# Patient Record
Sex: Female | Born: 1965 | Race: White | Hispanic: No | Smoking: Never smoker
Health system: Southern US, Community
[De-identification: ages and names within clinical notes are randomized; demographics above are authoritative.]

## PROBLEM LIST (undated history)

## (undated) ENCOUNTER — Emergency Department (HOSPITAL_COMMUNITY): Disposition: A | Discharge status: Home / Self Care | Payer: Self-pay

## (undated) DIAGNOSIS — M1990 Unspecified osteoarthritis, unspecified site: Secondary | ICD-10-CM

## (undated) DIAGNOSIS — D649 Anemia, unspecified: Secondary | ICD-10-CM

## (undated) DIAGNOSIS — G8929 Other chronic pain: Secondary | ICD-10-CM

## (undated) DIAGNOSIS — G43909 Migraine, unspecified, not intractable, without status migrainosus: Secondary | ICD-10-CM

## (undated) DIAGNOSIS — I2699 Other pulmonary embolism without acute cor pulmonale: Secondary | ICD-10-CM

## (undated) DIAGNOSIS — K219 Gastro-esophageal reflux disease without esophagitis: Secondary | ICD-10-CM

## (undated) DIAGNOSIS — R519 Headache, unspecified: Secondary | ICD-10-CM

## (undated) DIAGNOSIS — F419 Anxiety disorder, unspecified: Secondary | ICD-10-CM

## (undated) DIAGNOSIS — R51 Headache: Secondary | ICD-10-CM

## (undated) HISTORY — DX: Headache: R51

## (undated) HISTORY — PX: RECONSTRUCTION OF NOSE: SHX2301

## (undated) HISTORY — DX: Gastro-esophageal reflux disease without esophagitis: K21.9

## (undated) HISTORY — DX: Unspecified osteoarthritis, unspecified site: M19.90

## (undated) HISTORY — DX: Other pulmonary embolism without acute cor pulmonale: I26.99

## (undated) HISTORY — DX: Migraine, unspecified, not intractable, without status migrainosus: G43.909

## (undated) HISTORY — PX: NASAL SINUS SURGERY: SHX719

## (undated) HISTORY — PX: TONSILLECTOMY: SHX5217

## (undated) HISTORY — PX: OTHER SURGICAL HISTORY: SHX169

## (undated) HISTORY — DX: Other chronic pain: G89.29

## (undated) HISTORY — DX: Anxiety disorder, unspecified: F41.9

## (undated) HISTORY — DX: Headache, unspecified: R51.9

## (undated) HISTORY — DX: Anemia, unspecified: D64.9

---

## 2000-11-10 ENCOUNTER — Other Ambulatory Visit: Admission: RE | Admit: 2000-11-10 | Discharge: 2000-11-10 | Payer: Self-pay | Admitting: Obstetrics and Gynecology

## 2001-12-02 ENCOUNTER — Other Ambulatory Visit: Admission: RE | Admit: 2001-12-02 | Discharge: 2001-12-02 | Payer: Self-pay | Admitting: Obstetrics and Gynecology

## 2007-07-29 HISTORY — PX: COLONOSCOPY: SHX174

## 2007-07-29 HISTORY — PX: ESOPHAGOGASTRODUODENOSCOPY: SHX1529

## 2007-10-15 ENCOUNTER — Ambulatory Visit: Payer: Self-pay | Admitting: Gastroenterology

## 2007-10-25 ENCOUNTER — Ambulatory Visit: Payer: Self-pay | Admitting: Gastroenterology

## 2007-11-15 ENCOUNTER — Ambulatory Visit: Payer: Self-pay | Admitting: Gastroenterology

## 2007-11-15 LAB — CONVERTED CEMR LAB
Fecal Occult Blood: NEGATIVE
OCCULT 5: NEGATIVE

## 2007-11-18 ENCOUNTER — Ambulatory Visit: Payer: Self-pay | Admitting: Gastroenterology

## 2007-11-18 DIAGNOSIS — K209 Esophagitis, unspecified without bleeding: Secondary | ICD-10-CM | POA: Insufficient documentation

## 2007-11-20 DIAGNOSIS — Z9089 Acquired absence of other organs: Secondary | ICD-10-CM | POA: Insufficient documentation

## 2007-11-20 DIAGNOSIS — K219 Gastro-esophageal reflux disease without esophagitis: Secondary | ICD-10-CM | POA: Insufficient documentation

## 2007-11-20 DIAGNOSIS — F341 Dysthymic disorder: Secondary | ICD-10-CM

## 2007-11-24 ENCOUNTER — Telehealth: Payer: Self-pay | Admitting: Gastroenterology

## 2007-11-25 ENCOUNTER — Encounter: Payer: Self-pay | Admitting: Gastroenterology

## 2007-12-16 DIAGNOSIS — R519 Headache, unspecified: Secondary | ICD-10-CM | POA: Insufficient documentation

## 2007-12-16 DIAGNOSIS — R51 Headache: Secondary | ICD-10-CM | POA: Insufficient documentation

## 2010-12-10 NOTE — Letter (Signed)
November 18, 2007    Ms. Eino Farber   RE:  DAVIS, VANNATTER  MRN:  [id number]  /  DOB:  [birth year]   Dear Ms. Karnes:   It is my pleasure to have treated you recently as a new patient in my  office.  I appreciate your confidence and the opportunity to participate  in your care.   Since I do have a busy inpatient endoscopy schedule and office schedule,  my office hours vary weekly.  I am, however, available for emergency  calls every day through my office.  If I cannot promptly meet an urgent  office appointment, another one of our gastroenterologists will be able  to assist you.   My well-trained staff are prepared to help you at all times.  For  emergencies after office hours, a physician from our gastroenterology  section is always available through my 24-hour answering service.   While you are under my care, I encourage discussion of your questions  and concerns, and I will be happy to return your calls as soon as I am  available.   Once again, I welcome you as a new patient and I look forward to a happy  and healthy relationship.    Sincerely,      Barbette Hair. Arlyce Dice, MD,FACG  Electronically Signed   RDK/MedQ  DD: 11/18/2007  DT: 11/18/2007  Job #: [id number]

## 2010-12-10 NOTE — Assessment & Plan Note (Signed)
[facility]                         GASTROENTEROLOGY OFFICE NOTE   NAME:Jennifer Buckley, Jennifer Buckley                        MRN:          [id number]  DATE:11/18/2007                            DOB:          01-Aug-1965    PROBLEM:  Acid reflux.   Jennifer Buckley is a pleasant 45 year old white female complaining of a burning  chest discomfort.  This has been a problem for years.  Despite various  PPIs, she is suffering from frequent regurgitation and gastric contents,  hoarseness, sore throat, and pyrosis.  She has nausea especially in the  mornings and often will gag.  She currently is taking kapidex without  much improvement.  She  does take Naprosyn up to twice a week for back  pain.  She denies dysphagia or odynophagia.  Her other GI problem is hemoccult positive stools.  This prompted a  colonoscopy on October 25, 2007, which was entirely normal.  Followup  hemoccults were negative.   PAST MEDICAL HISTORY:  1. Pertinent for anxiety and depression.  2. She suffers from chronic headaches.  3. She is status post tonsillectomy and sinus surgery.   FAMILY HISTORY:  Noncontributory.   MEDICATIONS:  Femhrt, Mirapex, fluoxetine, kapidex.   ALLERGIES:  None.   She neither smokes nor drinks.  She is married and works as a Nurse, mental health.   REVIEW OF SYSTEMS:  Positive for anxiety, fatigue, headaches, and  constipation, otherwise negative.   PHYSICAL EXAMINATION:  GENERAL:  She is a healthy-appearing female.  VITAL SIGNS:  Pulse 84, blood pressure 92/64, weight 163.  HEENT: EOMI.  PERRLA.  Sclerae are anicteric.  Conjunctivae are pink.  NECK:  Supple without thyromegaly, adenopathy or carotid bruits.  CHEST:  Clear to auscultation and percussion without adventitious  sounds.  CARDIAC:  Regular rhythm; normal S1 S2.  There are no murmurs, gallops  or rubs.  ABDOMEN:  Bowel sounds are normoactive.  Abdomen is soft, nontender and  nondistended.  There are no abdominal  masses, tenderness, splenic  enlargement or hepatomegaly.  EXTREMITIES:  Full range of motion.  No cyanosis, clubbing or edema.  RECTAL:  Deferred.  SKELETAL:  There are no deformities.  NEUROLOGIC:  She is alert.  There are no focal abnormalities.   IMPRESSION:  Persistent gastroesophageal reflux disease despite proton  pump inhibitor therapy.  Her nonsteroidal anti-inflammatory use may  contributory.   RECOMMENDATION:  1. Hold Naprosyn.  2. Trial of Zegerid 40 mg nightly and every morning.  3. Upper endoscopy.     Barbette Hair. Arlyce Dice, MD,FACG  Electronically Signed    RDK/MedQ  DD: 11/18/2007  DT: 11/18/2007  Job #: [id number]

## 2013-04-27 ENCOUNTER — Ambulatory Visit
Admission: RE | Admit: 2013-04-27 | Discharge: 2013-04-27 | Disposition: A | Discharge status: Home / Self Care | Payer: BC Managed Care – PPO | Source: Ambulatory Visit | Attending: Dermatology | Admitting: Dermatology

## 2013-04-27 ENCOUNTER — Other Ambulatory Visit: Payer: Self-pay | Admitting: Dermatology

## 2013-04-27 DIAGNOSIS — L503 Dermatographic urticaria: Secondary | ICD-10-CM

## 2013-04-27 DIAGNOSIS — L299 Pruritus, unspecified: Secondary | ICD-10-CM

## 2013-07-04 ENCOUNTER — Encounter: Payer: Self-pay | Admitting: Gastroenterology

## 2013-08-09 ENCOUNTER — Ambulatory Visit (INDEPENDENT_AMBULATORY_CARE_PROVIDER_SITE_OTHER): Payer: BC Managed Care – PPO | Admitting: Gastroenterology

## 2013-08-09 ENCOUNTER — Encounter: Payer: Self-pay | Admitting: Gastroenterology

## 2013-08-09 VITALS — BP 100/70 | HR 84 | Ht 68.5 in | Wt 190.4 lb

## 2013-08-09 DIAGNOSIS — K625 Hemorrhage of anus and rectum: Secondary | ICD-10-CM

## 2013-08-09 DIAGNOSIS — K219 Gastro-esophageal reflux disease without esophagitis: Secondary | ICD-10-CM

## 2013-08-09 NOTE — Assessment & Plan Note (Signed)
The patient is symptomatic despite taking Prevacid.  It is noteworthy that she is taking Prevacid with meals or after meals.  Recommendations #1 patient was instructed to take Prevacid before breakfast and dinner #2 antireflux measures

## 2013-08-09 NOTE — Patient Instructions (Signed)
Your Banding is scheduled on 09/19/2013 at 9am Monday   We are giving you Zegerid samples today Use twice a day

## 2013-08-09 NOTE — Assessment & Plan Note (Signed)
I saw suspect patient's bleeding is 2 to hemorrhoidal bleeding.  She likely has grade 2 hemorrhoids.  Recommendations #1 band ligation of internal hemorrhoids #2 fiber supplementation

## 2013-08-09 NOTE — Progress Notes (Signed)
_                                                                                                                History of Present Illness: 48 year old white female referred for evaluation of rectal bleeding.  On several occasions she has had bright red blood per rectum consisting of blood mixed with the stools and discoloring the water.  She has squirted blood.  She denies rectal or abdominal pain.  She has had some straining at the stool.  2009 colonoscopy for Hemoccult-positive stool was negative.  She also complains of recurrent pyrosis despite taking Prevacid after breakfast and dinner.  She's tried other PPIs without much improvement.  She denies dysphagia.  2009 upper endoscopy demonstrated nonerosive esophagitis.    Past Medical History  Diagnosis Date  . Anemia   . Anxiety   . Arthritis   . Chronic headaches   . GERD (gastroesophageal reflux disease)    Past Surgical History  Procedure Laterality Date  . Tonsillectomy    . Nasal sinus surgery    . Reconstruction of nose     family history includes Diabetes in her maternal grandmother; Heart disease in her paternal grandfather; Prostate cancer in her maternal uncle. Current Outpatient Prescriptions  Medication Sig Dispense Refill  . ALPRAZolam (XANAX) 1 MG tablet       . cyclobenzaprine (FLEXERIL) 10 MG tablet Take 10 mg by mouth 3 (three) times daily as needed for muscle spasms.      Marland Kitchen doxepin (SINEQUAN) 25 MG capsule       . escitalopram (LEXAPRO) 20 MG tablet       . estradiol-norethindrone (ACTIVELLA) 1-0.5 MG per tablet       . fluticasone (FLONASE) 50 MCG/ACT nasal spray       . gabapentin (NEURONTIN) 100 MG capsule       . lansoprazole (PREVACID) 30 MG capsule       . mirtazapine (REMERON) 45 MG tablet       . montelukast (SINGULAIR) 10 MG tablet       . pramipexole (MIRAPEX) 0.25 MG tablet       . ranitidine (ZANTAC) 150 MG tablet       . topiramate (TOPAMAX) 100 MG tablet        No current  facility-administered medications for this visit.   Allergies as of 08/09/2013 - Review Complete 08/09/2013  Allergen Reaction Noted  . Codeine  12/16/2007    reports that she has never smoked. She has never used smokeless tobacco. She reports that she does not drink alcohol or use illicit drugs.     Review of Systems: She has back leg and foot pain.  She's under stress from work.  She's had some breakthrough vaginal bleeding despite taking estrogens.  Pertinent positive and negative review of systems were noted in the above HPI section. All other review of systems were otherwise negative.  Vital signs were reviewed in today's medical record Physical Exam: General: Well developed , well nourished,  no acute distress Skin: anicteric Head: Normocephalic and atraumatic Eyes:  sclerae anicteric, EOMI Ears: Normal auditory acuity Mouth: No deformity or lesions Neck: Supple, no masses or thyromegaly Lungs: Clear throughout to auscultation Heart: Regular rate and rhythm; no murmurs, rubs or bruits Abdomen: Soft, non tender and non distended. No masses, hepatosplenomegaly or hernias noted. Normal Bowel sounds Rectal: There are no external abnormalities Musculoskeletal: Symmetrical with no gross deformities  Skin: No lesions on visible extremities Pulses:  Normal pulses noted Extremities: No clubbing, cyanosis, edema or deformities noted Neurological: Alert oriented x 4, grossly nonfocal Cervical Nodes:  No significant cervical adenopathy Inguinal Nodes: No significant inguinal adenopathy Psychological:  Alert and cooperative. Normal mood and affect  See Assessment and Plan under Problem List

## 2013-09-19 ENCOUNTER — Encounter: Payer: BC Managed Care – PPO | Admitting: Gastroenterology

## 2013-10-07 ENCOUNTER — Ambulatory Visit (INDEPENDENT_AMBULATORY_CARE_PROVIDER_SITE_OTHER): Payer: BC Managed Care – PPO | Admitting: Gastroenterology

## 2013-10-07 ENCOUNTER — Encounter: Payer: Self-pay | Admitting: Gastroenterology

## 2013-10-07 VITALS — BP 100/70 | HR 100 | Ht 68.0 in | Wt 192.0 lb

## 2013-10-07 DIAGNOSIS — K648 Other hemorrhoids: Secondary | ICD-10-CM

## 2013-10-07 MED ORDER — OMEPRAZOLE-SODIUM BICARBONATE 40-1100 MG PO CAPS: 1.0000 | ORAL_CAPSULE | Freq: Every day | ORAL | Status: DC

## 2013-10-07 NOTE — Progress Notes (Signed)
PROCEDURE NOTE:  Anoscopy was performed.  Internal hemorrhoids were identified.  The patient presents with symptomatic grade *2**  hemorrhoids, requesting rubber band ligation of his/her hemorrhoidal disease.  All risks, benefits and alternative forms of therapy were described and informed consent was obtained.   The anorectum was pre-medicated with lubricant and nitroglycerine ointment The decision was made to band the **right posterior* internal hemorrhoid, and the [place] was used to perform band ligation without complication.  Digital anorectal examination was then performed to assure proper positioning of the band, and to adjust the banded tissue as required.  The patient was discharged home without pain or other issues.  Dietary and behavioral recommendations were given and along with follow-up instructions.    The patient will return in *2** for  follow-up and possible additional banding as required. No complications were encountered and the patient tolerated the procedure well.

## 2013-10-07 NOTE — Patient Instructions (Signed)
HEMORRHOID BANDING PROCEDURE    FOLLOW-UP CARE   1. The procedure you have had should have been relatively painless since the banding of the area involved does not have nerve endings and there is no pain sensation.  The rubber band cuts off the blood supply to the hemorrhoid and the band may fall off as soon as 48 hours after the banding (the band may occasionally be seen in the toilet bowl following a bowel movement). You may notice a temporary feeling of fullness in the rectum which should respond adequately to plain Tylenol or Motrin.  2. Following the banding, avoid strenuous exercise that evening and resume full activity the next day.  A sitz bath (soaking in a warm tub) or bidet is soothing, and can be useful for cleansing the area after bowel movements.     3. To avoid constipation, take two tablespoons of natural wheat bran, natural oat bran, flax, Benefiber or any over the counter fiber supplement and increase your water intake to 7-8 glasses daily.    4. Unless you have been prescribed anorectal medication, do not put anything inside your rectum for two weeks: No suppositories, enemas, fingers, etc.  5. Occasionally, you may have more bleeding than usual after the banding procedure.  This is often from the untreated hemorrhoids rather than the treated one.  Don't be concerned if there is a tablespoon or so of blood.  If there is more blood than this, lie flat with your bottom higher than your head and apply an ice pack to the area. If the bleeding does not stop within a half an hour or if you feel faint, call our office at [phone] or go to the emergency room.  6. Problems are not common; however, if there is a substantial amount of bleeding, severe pain, chills, fever or difficulty passing urine (very rare) or other problems, you should call us at [phone] or report to the nearest emergency room.  7. Do not stay seated continuously for more than 2-3 hours for a day or two  after the procedure.  Tighten your buttock muscles 10-15 times every two hours and take 10-15 deep breaths every 1-2 hours.  Do not spend more than a few minutes on the toilet if you cannot empty your bowel; instead re-visit the toilet at a later time.   Your second banding is scheduled on 11/18/2013 at 9:15am

## 2013-10-14 ENCOUNTER — Telehealth: Payer: Self-pay | Admitting: *Deleted

## 2013-10-14 NOTE — Telephone Encounter (Signed)
PT APPROVED FOR ZEGERID  TILL 10/13/2014

## 2013-11-08 ENCOUNTER — Encounter: Payer: Self-pay | Admitting: *Deleted

## 2013-11-10 ENCOUNTER — Encounter: Payer: Self-pay | Admitting: Gastroenterology

## 2013-11-10 ENCOUNTER — Ambulatory Visit (INDEPENDENT_AMBULATORY_CARE_PROVIDER_SITE_OTHER): Payer: BC Managed Care – PPO | Admitting: Gastroenterology

## 2013-11-10 VITALS — BP 84/66 | HR 100 | Ht 68.0 in | Wt 189.4 lb

## 2013-11-10 DIAGNOSIS — R1310 Dysphagia, unspecified: Secondary | ICD-10-CM | POA: Insufficient documentation

## 2013-11-10 DIAGNOSIS — K219 Gastro-esophageal reflux disease without esophagitis: Secondary | ICD-10-CM

## 2013-11-10 MED ORDER — SUCRALFATE 1 GM/10ML PO SUSP: 1.0000 g | Freq: Four times a day (QID) | ORAL | Status: DC

## 2013-11-10 MED ORDER — DEXLANSOPRAZOLE 60 MG PO CPDR: 60.0000 mg | DELAYED_RELEASE_CAPSULE | Freq: Every day | ORAL | Status: DC

## 2013-11-10 NOTE — Patient Instructions (Signed)
We have given you samples of the following medication to take: Dexilant 60 mg, please take one capsule by mouth once daily If this works well please call back for a prescription  We have sent the following medications to your pharmacy for you to pick up at your convenience: Carafate suspension, please take before meals and at bedtime   Please follow up with Dr. Deatra Ina in four to six weeks

## 2013-11-10 NOTE — Progress Notes (Signed)
11/10/2013 Jennifer Buckley [id number] 10-30-1965   History of Present Illness:  This is a 48 year old female who is known to Dr. Deatra Ina. She has seen him recently for complaints of worsening reflux. She has long-standing reflux symptoms and last EGD in April 2009 showed esophagitis. She has been on several different PPIs throughout the years. Most recently she had been on Prevacid for quite some time, which she was taking twice daily. She was recently changed to Zegerid 40 mg, which she was also taking twice daily. She also has listed that she is taking Zantac 150 mg twice a day.  Recently, she had an injury to her foot and she was given some oral prednisone to help with the inflammation. On Monday she had a severe migraine and took 4 Aleve. She fell asleep after taking the medication, but states that she was lying with her head significantly elevated.  When she woke she had severe pain and burning in her esophagus and epigastrium, however. She reports that the burning sensation is very severe even when trying to drink a sip of liquid.  Her symptoms have gotten slightly better over the past couple of days, but are still very bothersome. She is wondering if she should switch back to the Prevacid in place of the Zegerid.   Current Medications, Allergies, Past Medical History, Past Surgical History, Family History and Social History were reviewed in [employer] record.   Physical Exam: BP 84/66  Pulse 100  Ht 5\' 8"  (1.727 m)  Wt 189 lb 6 oz (85.9 kg)  BMI 28.80 kg/m2 General: Well developed white female in no acute distress Head: Normocephalic and atraumatic Eyes:  Sclerae anicteric, conjunctiva pink  Ears: Normal auditory acuity Lungs: Clear throughout to auscultation Heart: Regular rate and rhythm Abdomen: Soft, non-distended.  Normal bowel sounds.  Mild-moderate epigastric TTP without R/R/G. Musculoskeletal: Symmetrical with no gross deformities  Extremities: No edema   Neurological: Alert oriented x 4, grossly non-focal Psychological:  Alert and cooperative. Normal mood and affect  Assessment and Recommendations: -GERD and ? Pill esophagitis with odynophagia:  Significantly worse recently despite the change in medication to Zegerid several weeks ago.  Will change PPI to Dexilant 60 mg daily (samples and prescription given) and will add carafate suspension four times a day for approximately one month.  She will follow-up in 4-6 weeks.  If no improvement or change in symptoms then may want to consider repeat EGD and other evaluation (? pH monitoring).  ? Candidate for [place].  She also needs to be sure to remain upright for several hours after taking any NSAID medication.

## 2013-11-13 NOTE — Progress Notes (Signed)
Reviewed and agree with management.  If not improved in next week would schedule EGD with [facility] pH probe while on PPI rx Sandy Salaam. Deatra Ina, M.D., [facility]

## 2013-11-18 ENCOUNTER — Encounter: Payer: BC Managed Care – PPO | Admitting: Gastroenterology

## 2013-11-21 ENCOUNTER — Telehealth: Payer: Self-pay | Admitting: Gastroenterology

## 2013-11-21 NOTE — Telephone Encounter (Signed)
Left message for pt to call back  °

## 2013-11-22 ENCOUNTER — Telehealth: Payer: Self-pay | Admitting: *Deleted

## 2013-11-22 ENCOUNTER — Encounter: Payer: BC Managed Care – PPO | Admitting: Gastroenterology

## 2013-11-22 MED ORDER — DEXLANSOPRAZOLE 60 MG PO CPDR: 60.0000 mg | DELAYED_RELEASE_CAPSULE | Freq: Every day | ORAL | Status: DC

## 2013-11-22 NOTE — Telephone Encounter (Signed)
Received via fax from [employer] that prior auth is needed for patients Dexilant. I called [phone] and spoke with Herbie Baltimore. Patient is approved until 11-15-2014. Case ID: [id number] Fax will be sent to [place] and pharmacy. Patient will be notified as well.

## 2013-11-22 NOTE — Telephone Encounter (Signed)
Pt called and states that she thinks the Dexilant is working and she would like a script sent to her pharmacy. Script sent in.

## 2013-12-08 ENCOUNTER — Ambulatory Visit: Payer: BC Managed Care – PPO | Admitting: Gastroenterology

## 2013-12-13 ENCOUNTER — Telehealth: Payer: Self-pay | Admitting: Gastroenterology

## 2013-12-13 NOTE — Telephone Encounter (Signed)
Pt was seen on 11/10/13 for reflux. States she has been taking Dexilant daily but it is not lasting. Reports that she is waking up at night with "acid in her stomach." Pt wanted to know if she should go back to her Prevacid or if she should try something else. Please advise.

## 2013-12-13 NOTE — Telephone Encounter (Signed)
Pt states she took Protonix and it did not work. Pt was taking Prevacid 30mg  BID prior to the [place]. Can we send in script for Prevacid?

## 2013-12-13 NOTE — Telephone Encounter (Signed)
Left message for pt to call back  °

## 2013-12-13 NOTE — Telephone Encounter (Signed)
She can go back to the protonix 40 mg twice daily, but she should be scheduled for EGD with [facility] pH monitoring while on PPI therapy with Deatra Ina.  Thank you,  Jess

## 2013-12-13 NOTE — Telephone Encounter (Signed)
Sorry, yes we can send a prescription for prevacid.  I mis-read your message.  And schedule the EGD with B[facility] pH.  Thank you,  Jess

## 2013-12-14 MED ORDER — LANSOPRAZOLE 30 MG PO CPDR: 30.0000 mg | DELAYED_RELEASE_CAPSULE | Freq: Two times a day (BID) | ORAL | Status: DC

## 2013-12-14 NOTE — Telephone Encounter (Signed)
Script sent to pharmacy. Pt will wait to scheduled EGD with Bravo when she comes in for her next visit.

## 2014-01-16 ENCOUNTER — Telehealth: Payer: Self-pay | Admitting: *Deleted

## 2014-01-16 MED ORDER — LANSOPRAZOLE 30 MG PO CPDR: 30.0000 mg | DELAYED_RELEASE_CAPSULE | Freq: Two times a day (BID) | ORAL | Status: DC

## 2014-01-16 NOTE — Telephone Encounter (Signed)
Lansoprazole rx sent to pharmacy.

## 2014-01-16 NOTE — Telephone Encounter (Signed)
Yes, ok to refill 

## 2014-01-16 NOTE — Telephone Encounter (Signed)
Patient requested refill of Lansoprazole. Is it okay to refill?

## 2014-01-17 ENCOUNTER — Encounter: Payer: BC Managed Care – PPO | Admitting: Gastroenterology

## 2014-04-24 ENCOUNTER — Telehealth: Payer: Self-pay | Admitting: *Deleted

## 2014-04-24 MED ORDER — LANSOPRAZOLE 30 MG PO CPDR: 30.0000 mg | DELAYED_RELEASE_CAPSULE | Freq: Two times a day (BID) | ORAL | Status: DC

## 2014-04-24 NOTE — Telephone Encounter (Signed)
Request from [facility], patient needs refill on Lansoprazole. Patient needs to schedule an EGD with [facility], per Alonza Bogus., PA-C. Patient had an office visit scheduled with Dr. Deatra Ina and cancelled. For further refills patient will need to make an appointment with Dr. Deatra Ina.

## 2014-06-28 ENCOUNTER — Telehealth: Payer: Self-pay | Admitting: Gastroenterology

## 2014-06-28 ENCOUNTER — Encounter: Payer: Self-pay | Admitting: Gastroenterology

## 2014-06-29 MED ORDER — LANSOPRAZOLE 30 MG PO CPDR: 30.0000 mg | DELAYED_RELEASE_CAPSULE | Freq: Two times a day (BID) | ORAL | Status: DC

## 2014-06-29 NOTE — Telephone Encounter (Signed)
Called pt to inform med sent  

## 2014-08-28 ENCOUNTER — Other Ambulatory Visit: Payer: Self-pay | Admitting: Gastroenterology

## 2014-08-30 ENCOUNTER — Encounter: Payer: Self-pay | Admitting: Gastroenterology

## 2014-08-30 ENCOUNTER — Ambulatory Visit (INDEPENDENT_AMBULATORY_CARE_PROVIDER_SITE_OTHER): Payer: BC Managed Care – PPO | Admitting: Gastroenterology

## 2014-08-30 VITALS — BP 114/68 | HR 88 | Ht 68.0 in | Wt 198.2 lb

## 2014-08-30 DIAGNOSIS — K648 Other hemorrhoids: Secondary | ICD-10-CM

## 2014-08-30 DIAGNOSIS — K219 Gastro-esophageal reflux disease without esophagitis: Secondary | ICD-10-CM

## 2014-08-30 NOTE — Assessment & Plan Note (Signed)
No further bleeding status post band ligation 1

## 2014-08-30 NOTE — Assessment & Plan Note (Signed)
Patient has PPI--dependent GERD.  Plan to continue current regimen.

## 2014-08-30 NOTE — Patient Instructions (Signed)
We will renew your prevacid today Follow up as needed

## 2014-08-30 NOTE — Progress Notes (Signed)
      History of Present Illness:  Jennifer Buckley has returned for follow-up of GERD.  On a regimen of Prevacid twice a day she is symptom-free.  Should she miss a dose of Prevacid she has severe pyrosis.  Should she eat late at night she has a full sensation when she awakens in the morning.  She denies nausea.  She underwent band ligation of internal hemorrhoid 1.  She's had no further rectal bleeding.    Review of Systems: Pertinent positive and negative review of systems were noted in the above HPI section. All other review of systems were otherwise negative.    Current Medications, Allergies, Past Medical History, Past Surgical History, Family History and Social History were reviewed in [facility] record  Vital signs were reviewed in today's medical record. Physical Exam: General: Well developed , well nourished, no acute distress Skin: anicteric Head: Normocephalic and atraumatic Eyes:  sclerae anicteric, EOMI Ears: Normal auditory acuity Mouth: No deformity or lesions Lungs: Clear throughout to auscultation Heart: Regular rate and rhythm; no murmurs, rubs or bruits Abdomen: Soft, non tender and non distended. No masses, hepatosplenomegaly or hernias noted. Normal Bowel sounds.  There is no succussion splash Rectal:deferred Musculoskeletal: Symmetrical with no gross deformities  Pulses:  Normal pulses noted Extremities: No clubbing, cyanosis, edema or deformities noted Neurological: Alert oriented x 4, grossly nonfocal Psychological:  Alert and cooperative. Normal mood and affect  See Assessment and Plan under Problem List

## 2014-12-13 ENCOUNTER — Other Ambulatory Visit (HOSPITAL_COMMUNITY): Payer: Self-pay | Admitting: Dermatology

## 2014-12-13 ENCOUNTER — Ambulatory Visit (HOSPITAL_COMMUNITY)
Admission: RE | Admit: 2014-12-13 | Discharge: 2014-12-13 | Disposition: A | Discharge status: Home / Self Care | Payer: BC Managed Care – PPO | Source: Ambulatory Visit | Attending: Dermatology | Admitting: Dermatology

## 2014-12-13 DIAGNOSIS — J9811 Atelectasis: Secondary | ICD-10-CM | POA: Diagnosis not present

## 2014-12-13 DIAGNOSIS — L299 Pruritus, unspecified: Secondary | ICD-10-CM

## 2015-02-02 ENCOUNTER — Other Ambulatory Visit: Payer: Self-pay | Admitting: Gastroenterology

## 2015-04-25 ENCOUNTER — Telehealth: Payer: Self-pay | Admitting: *Deleted

## 2015-04-25 NOTE — Telephone Encounter (Signed)
See note from 04-25-2015.

## 2015-04-25 NOTE — Telephone Encounter (Signed)
Received by fax from [employer] an approval letter, regarding coverage for the Lansoprazole capsule . Coverage good from 03-24-2015 through 04-22-2016.  Prescriber Alonza Bogus PA-C. Patient also notified by letter and outbound phone call.

## 2015-05-04 ENCOUNTER — Other Ambulatory Visit: Payer: Self-pay | Admitting: Gastroenterology

## 2015-06-04 ENCOUNTER — Other Ambulatory Visit: Payer: Self-pay | Admitting: Gastroenterology

## 2015-07-12 ENCOUNTER — Telehealth: Payer: Self-pay | Admitting: Gastroenterology

## 2015-07-12 MED ORDER — LANSOPRAZOLE 30 MG PO CPDR: DELAYED_RELEASE_CAPSULE | ORAL | Status: DC

## 2015-07-12 NOTE — Telephone Encounter (Signed)
Med sent to pharmacy.

## 2015-09-11 ENCOUNTER — Other Ambulatory Visit: Payer: Self-pay | Admitting: Gastroenterology

## 2015-09-14 ENCOUNTER — Ambulatory Visit (INDEPENDENT_AMBULATORY_CARE_PROVIDER_SITE_OTHER): Payer: BC Managed Care – PPO | Admitting: Gastroenterology

## 2015-09-14 ENCOUNTER — Encounter: Payer: Self-pay | Admitting: Gastroenterology

## 2015-09-14 VITALS — BP 112/62 | HR 70 | Ht 68.0 in

## 2015-09-14 DIAGNOSIS — K5900 Constipation, unspecified: Secondary | ICD-10-CM

## 2015-09-14 DIAGNOSIS — K219 Gastro-esophageal reflux disease without esophagitis: Secondary | ICD-10-CM

## 2015-09-14 NOTE — Progress Notes (Signed)
Jennifer Buckley    [id number]    July 02, 1966  Primary Care Physician:BECK,MARK C., MD  Referring Physician: [place] [address]  [place], [place] [address]  Chief complaint:  GERD , constipation   HPI: 50 year old female here for follow-up visit for GERD and chronic constipation. She is taking Prevacid twice daily and feels even if she skips one dose she gets bad heartburn. She has gained weight over the past few years. Denies any dysphagia, nausea, vomiting, abdominal pain, melena or bright red blood per rectum. She had hemorrhoidal banding by Dr. Deatra Ina and no longer has blood per rectum. She just started taking MiraLAX half a capful daily for constipation and feels its improving.    Outpatient Encounter Prescriptions as of 09/14/2015  Medication Sig  . ALPRAZolam (XANAX) 1 MG tablet Take 1 mg by mouth 3 (three) times daily as needed.   . botulinum toxin Type A (BOTOX) 100 units SOLR injection Inject 100 Units into the muscle once. For migraines  . cetirizine (ZYRTEC) 10 MG tablet Take 10 mg by mouth daily.  . cyclobenzaprine (FLEXERIL) 10 MG tablet Take 10 mg by mouth 3 (three) times daily as needed for muscle spasms.  Marland Kitchen doxepin (SINEQUAN) 75 MG capsule Take 75 mg by mouth daily.  Marland Kitchen escitalopram (LEXAPRO) 20 MG tablet Take 20 mg by mouth daily.   Marland Kitchen estradiol-norethindrone (ACTIVELLA) 1-0.5 MG per tablet Take 1 tablet by mouth daily.   . fluticasone (FLONASE) 50 MCG/ACT nasal spray Place 2 sprays into both nostrils 2 (two) times daily.   . lansoprazole (PREVACID) 30 MG capsule TAKE 1 CAPSULE BY MOUTH 2 TIMES DAILY  . mirtazapine (REMERON) 45 MG tablet Take 45 mg by mouth at bedtime.   . montelukast (SINGULAIR) 10 MG tablet Take 10 mg by mouth daily.   . pramipexole (MIRAPEX) 0.25 MG tablet Take 0.25-0.5 mg by mouth at bedtime.   . ranitidine (ZANTAC) 150 MG tablet Take 150 mg by mouth 2 (two) times daily.   Marland Kitchen topiramate (TOPAMAX) 100 MG tablet Take  100 mg by mouth 2 (two) times daily.   . [DISCONTINUED] chlorproMAZINE (THORAZINE) 25 MG tablet Take 25 mg by mouth 4 (four) times daily as needed. Pt takes for migraines   No facility-administered encounter medications on file as of 09/14/2015.    Allergies as of 09/14/2015 - Review Complete 09/14/2015  Allergen Reaction Noted  . Codeine  12/16/2007  . Gabapentin  08/30/2014    Past Medical History  Diagnosis Date  . Anemia   . Anxiety   . Arthritis   . Chronic headaches   . GERD (gastroesophageal reflux disease)   . Migraines     Past Surgical History  Procedure Laterality Date  . Tonsillectomy    . Nasal sinus surgery    . Reconstruction of nose    . Colonoscopy  2009    normal  . Esophagogastroduodenoscopy  2009    esophagitis   . Mole on back      removed  . Mole on leg Left     removed    Family History  Problem Relation Age of Onset  . Prostate cancer Maternal Uncle   . Diabetes Maternal Grandmother   . Heart disease Paternal Grandfather     Social History   Social History  . Marital Status: Divorced    Spouse Name: N/A  . Number of Children: N/A  . Years  of Education: N/A   Occupational History  . Teacher    Social History Main Topics  . Smoking status: Never Smoker   . Smokeless tobacco: Never Used  . Alcohol Use: No  . Drug Use: No  . Sexual Activity: Not on file   Other Topics Concern  . Not on file   Social History Narrative      Review of systems: Review of Systems  Constitutional: Negative for fever and chills.  HENT: Negative.   Eyes: Negative for blurred vision.  Respiratory: Negative for cough, shortness of breath and wheezing.   Cardiovascular: Negative for chest pain and palpitations.  Gastrointestinal: as per HPI Genitourinary: Negative for dysuria, urgency, frequency and hematuria.  Musculoskeletal: Negative for myalgias, back pain and joint pain.  Skin: Negative for itching and rash.  Neurological: Negative for  dizziness, tremors, focal weakness, seizures and loss of consciousness.  Endo/Heme/Allergies: Negative for environmental allergies.  Psychiatric/Behavioral: Negative for depression, suicidal ideas and hallucinations.  All other systems reviewed and are negative.   Physical Exam: Filed Vitals:   09/14/15 1450  BP: 112/62  Pulse: 70   Gen:      No acute distress HEENT:  EOMI, sclera anicteric Neck:     No masses; no thyromegaly Lungs:    Clear to auscultation bilaterally; normal respiratory effort CV:         Regular rate and rhythm; no murmurs Abd:      + bowel sounds; soft, non-tender; no palpable masses, no distension Ext:    No edema; adequate peripheral perfusion Skin:      Warm and dry; no rash Neuro: alert and oriented x 3 Psych: normal mood and affect  Data Reviewed:  EGD 2009: Mild non erosive esophagitis Colonoscopy 2009: Normal    Assessment and Plan/Recommendations:  50 year old female with history of chronic GERD and constipation here for follow-up visit GERD symptoms well controlled on Prevacid twice daily Continue MiraLAX half a capful daily and titrate as needed based on symptoms Due for recall colonoscopy in 2019 Return in 1 year  K. Denzil Magnuson , MD [phone] Mon-Fri 8a-5p [phone] after 5p, weekends, holidays

## 2015-09-14 NOTE — Patient Instructions (Signed)
Follow up in 1 year We will refill your Prevacid 30mg  Twice a day

## 2016-01-01 IMAGING — CR DG CHEST 2V
2 series · 2 of 2 positions shown · non-contrast
Comparison: 04/27/2013

CLINICAL DATA: Skin itching

EXAM:
CHEST  2 VIEW

[w chest pa]
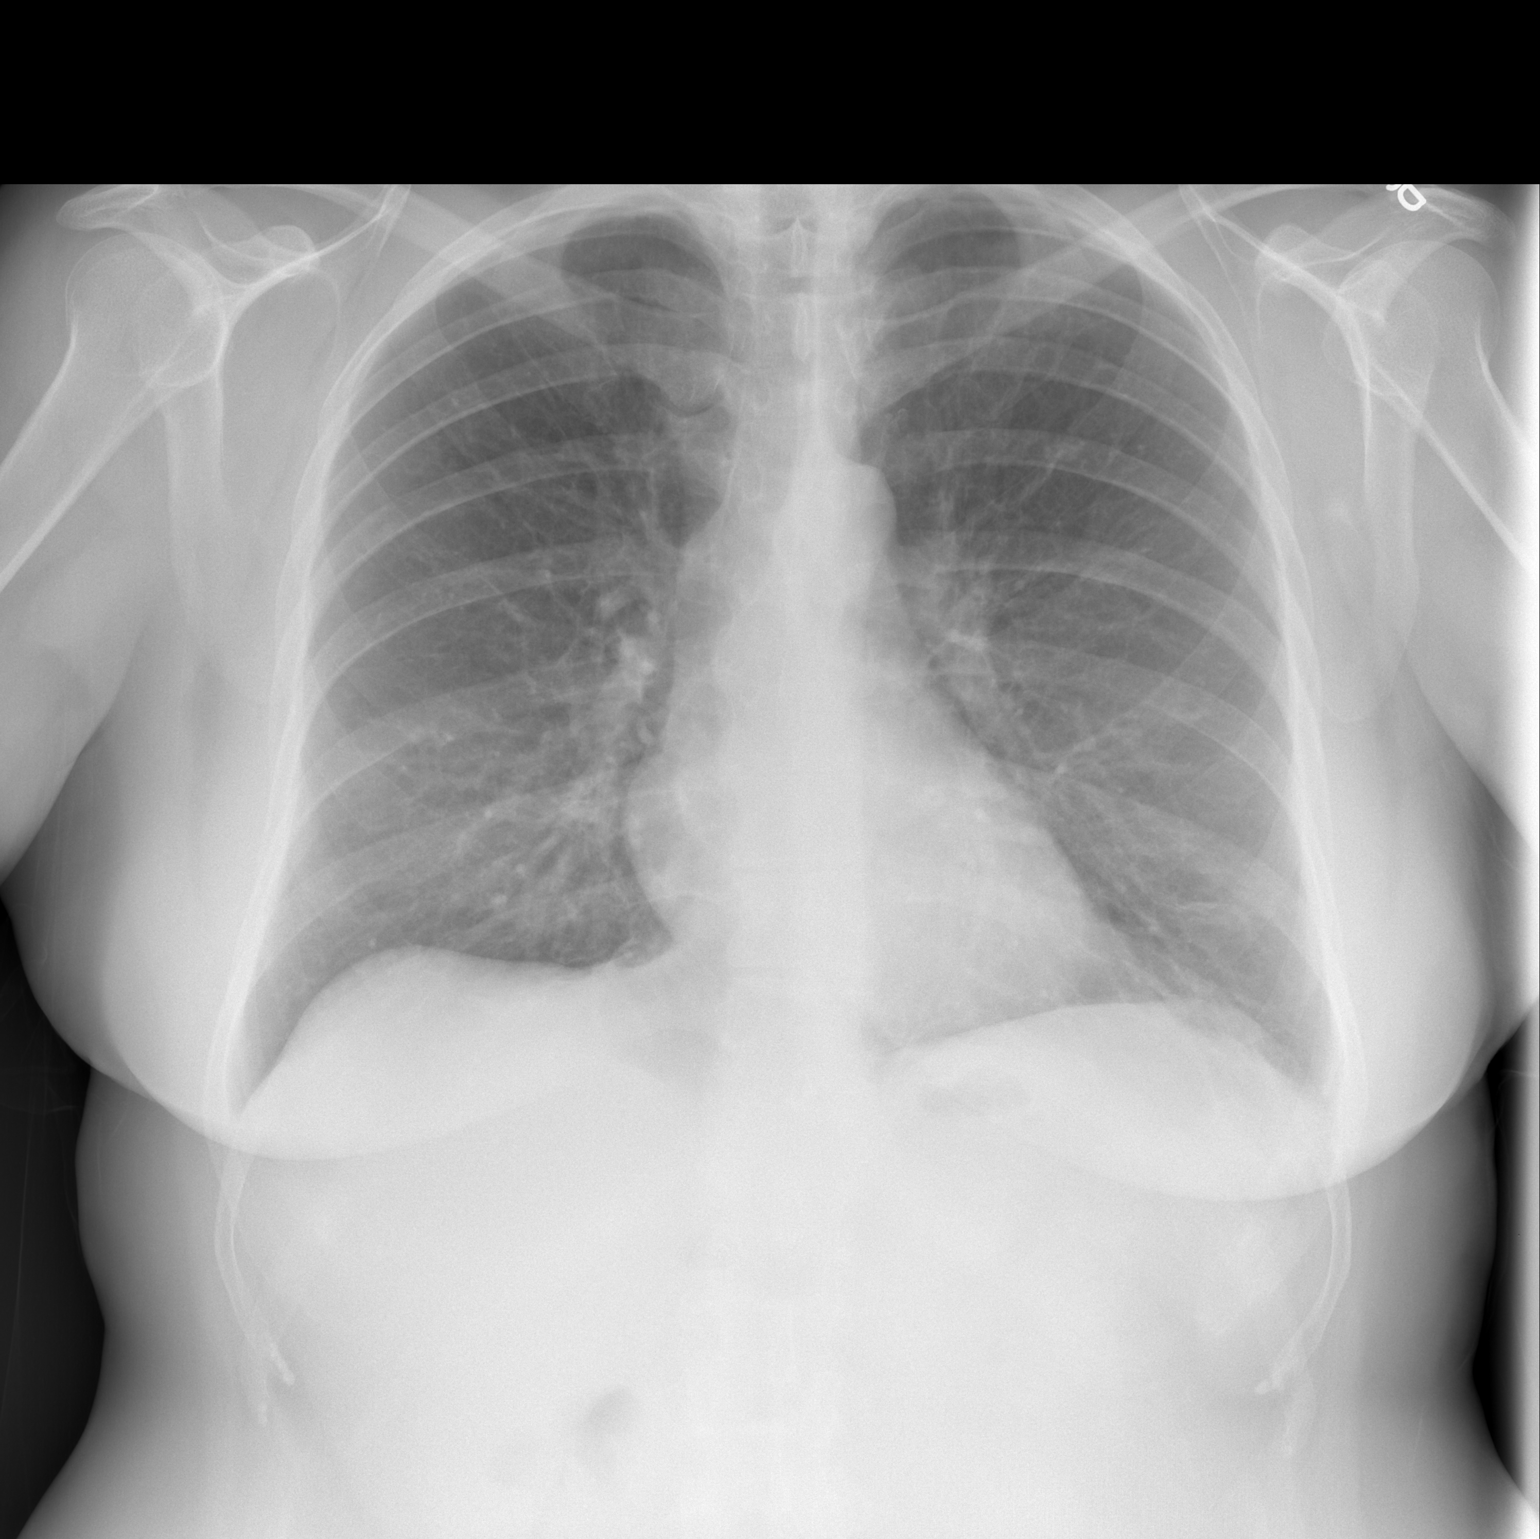

[w chest lat]
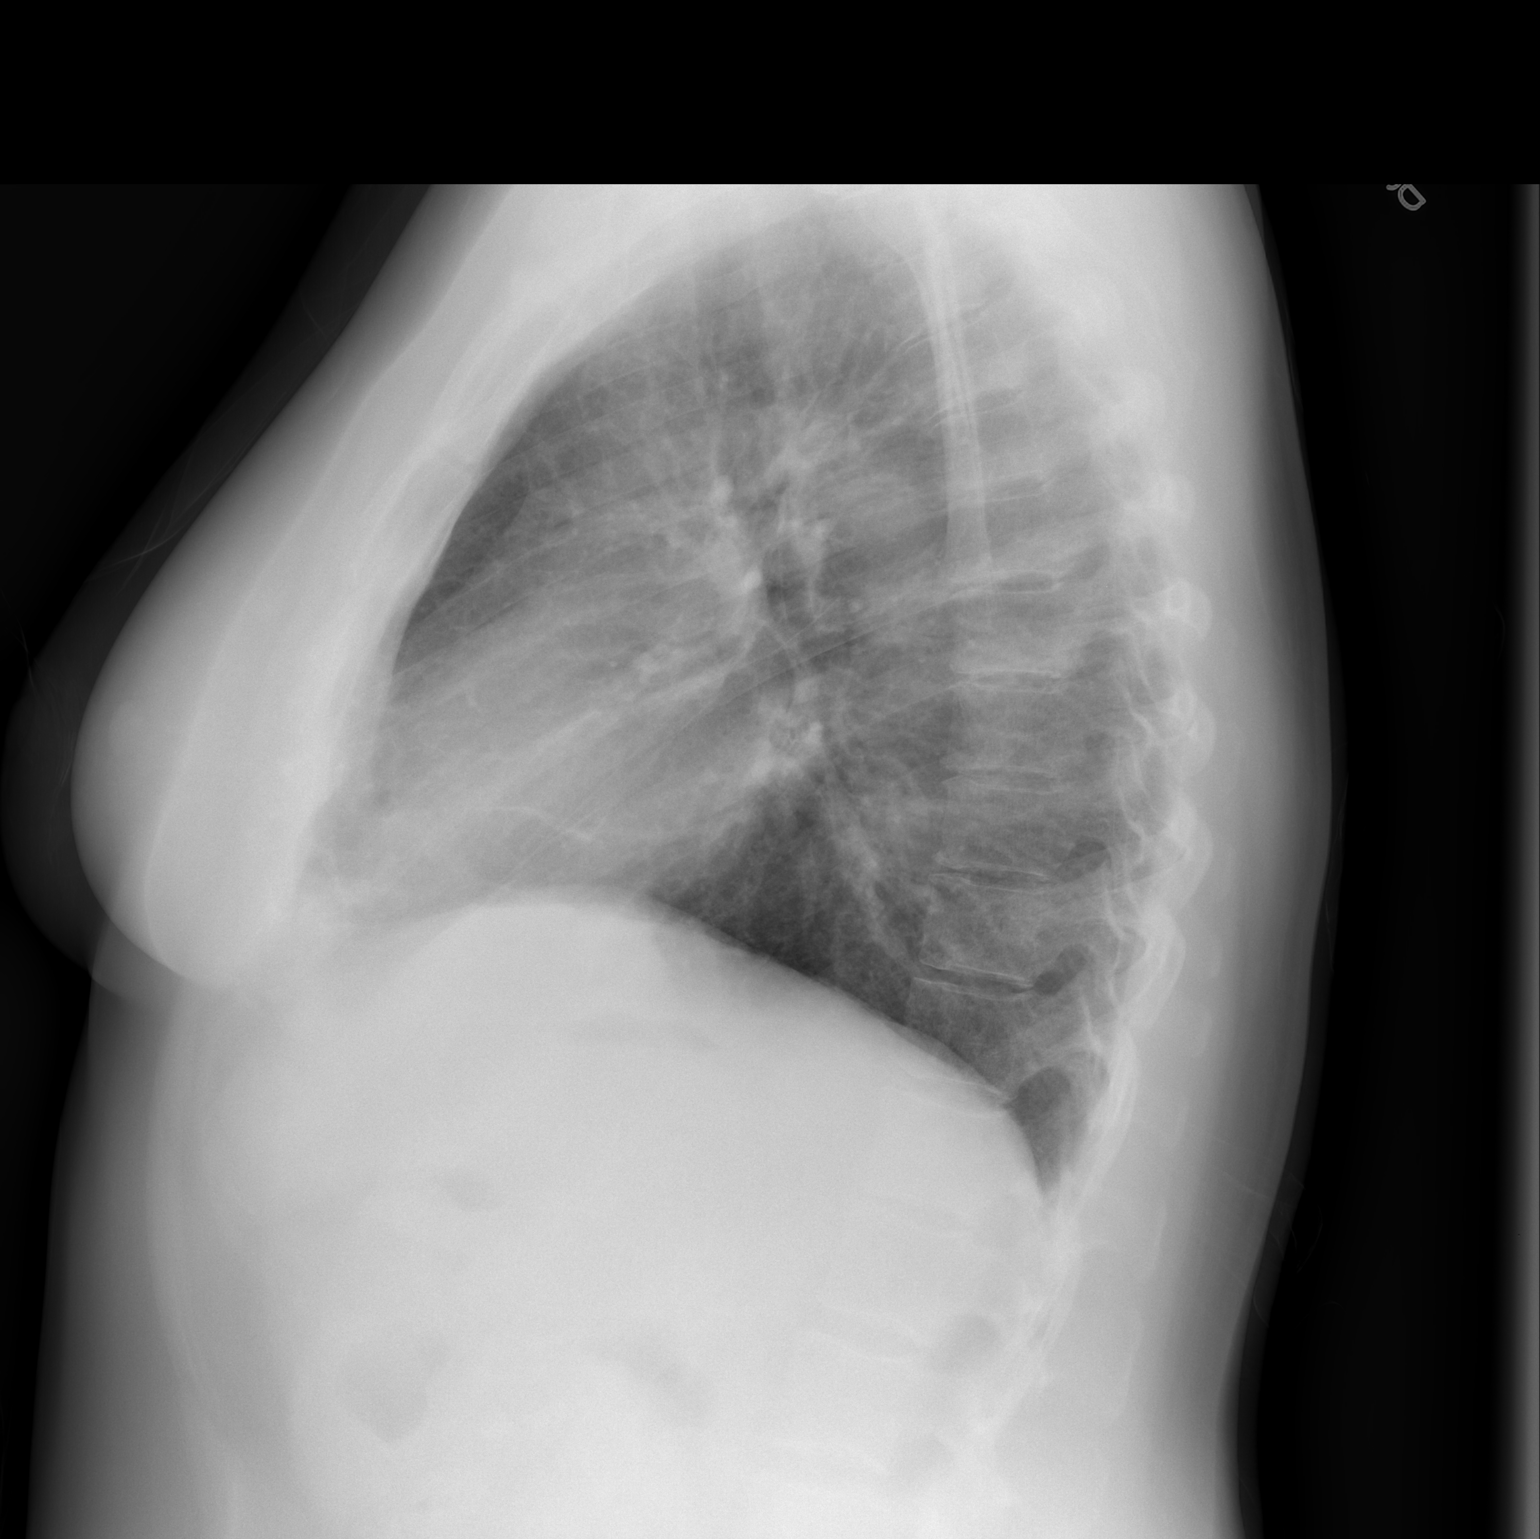

[2 of 2 positions shown; findings below may reference images not displayed]

FINDINGS: Cardiac shadow is within normal limits. The lungs are well aerated
bilaterally. Minimal left basilar atelectasis is seen. No focal
confluent infiltrate is noted. No other focal abnormality is seen.
IMPRESSION: Minimal left basilar atelectasis.

## 2016-03-19 ENCOUNTER — Encounter: Payer: Self-pay | Admitting: Gastroenterology

## 2016-03-19 ENCOUNTER — Ambulatory Visit (INDEPENDENT_AMBULATORY_CARE_PROVIDER_SITE_OTHER): Payer: BC Managed Care – PPO | Admitting: Gastroenterology

## 2016-03-19 VITALS — BP 112/60 | HR 96 | Ht 68.0 in | Wt 218.4 lb

## 2016-03-19 DIAGNOSIS — K5900 Constipation, unspecified: Secondary | ICD-10-CM

## 2016-03-19 DIAGNOSIS — K219 Gastro-esophageal reflux disease without esophagitis: Secondary | ICD-10-CM

## 2016-03-19 DIAGNOSIS — R1013 Epigastric pain: Secondary | ICD-10-CM | POA: Diagnosis not present

## 2016-03-19 MED ORDER — LINACLOTIDE 72 MCG PO CAPS: 72.0000 ug | ORAL_CAPSULE | Freq: Every day | ORAL | 3 refills | Status: DC

## 2016-03-19 MED ORDER — RANITIDINE HCL 300 MG PO TABS: 300.0000 mg | ORAL_TABLET | Freq: Every day | ORAL | 3 refills | Status: DC

## 2016-03-19 NOTE — Patient Instructions (Addendum)
You have been scheduled for an endoscopy. Please follow written instructions given to you at your visit today. If you use inhalers (even only as needed), please bring them with you on the day of your procedure. Your physician has requested that you go to www.startemmi.com and enter the access code given to you at your visit today. This web site gives a general overview about your procedure. However, you should still follow specific instructions given to you by our office regarding your preparation for the procedure.   Take Prevacid 30 minutes before breakfast and dinner  Take Zantac 300mg  daily at bedtime  We will send Linzess 72 mcg to your pharmacy  Use VSL #3 probiotic 112 units daily, this can be purchased over the counter at your pharmacy

## 2016-03-19 NOTE — Progress Notes (Signed)
Jennifer Buckley    [id number]    01/03/1966  Primary Care Physician:BECK,MARK C., MD  Referring Physician: [place] [address]  [place], [place] [address]  Chief complaint:  GERD, Constipation  HPI:  66 yr F previously followed by Dr Deatra Ina, was last seen in office in Feb 2016 here with worsening GERD symptoms, Prevacid is no longer helping. She has been on Prilosec, Protonix and Nexium in the past. All seem to work for some time but stop working later. She has regurgitation and pyrosis. Last EGD in 2009 with findings suggestive of esophagitis. Colonoscopy in March 2009 was normal. Denies any nausea, vomiting, abdominal pain, melena or bright red blood per rectum. She also c/o intermittent epigastric pain and worsening constipation with irregular bowel habits   Outpatient Encounter Prescriptions as of 03/19/2016  Medication Sig  . ALPRAZolam (XANAX) 1 MG tablet Take 1 mg by mouth 3 (three) times daily as needed.   . botulinum toxin Type A (BOTOX) 100 units SOLR injection Inject 100 Units into the muscle once. For migraines  . cetirizine (ZYRTEC) 10 MG tablet Take 10 mg by mouth daily.  . cyclobenzaprine (FLEXERIL) 10 MG tablet Take 10 mg by mouth 3 (three) times daily as needed for muscle spasms.  Marland Kitchen doxepin (SINEQUAN) 75 MG capsule Take 75 mg by mouth daily.  . DULoxetine (CYMBALTA) 30 MG capsule Take 30 mg by mouth daily.  Marland Kitchen estrogens, conjugated, (PREMARIN) 1.25 MG tablet Take 1.25 mg by mouth daily.  . fluticasone (FLONASE) 50 MCG/ACT nasal spray Place 2 sprays into both nostrils 2 (two) times daily.   Marland Kitchen Ketorolac Tromethamine (SPRIX NA) Place into the nose.  . lansoprazole (PREVACID) 30 MG capsule TAKE 1 CAPSULE BY MOUTH 2 TIMES DAILY  . mirtazapine (REMERON) 45 MG tablet Take 45 mg by mouth at bedtime.   . montelukast (SINGULAIR) 10 MG tablet Take 10 mg by mouth daily.   . pramipexole (MIRAPEX) 0.25 MG tablet Take 0.25-0.5 mg by mouth at  bedtime.   . progesterone (PROMETRIUM) 100 MG capsule Take 100 mg by mouth daily.  Marland Kitchen topiramate (TOPAMAX) 100 MG tablet Take 100 mg by mouth 2 (two) times daily.   . [DISCONTINUED] escitalopram (LEXAPRO) 20 MG tablet Take 20 mg by mouth daily.   . [DISCONTINUED] estradiol-norethindrone (ACTIVELLA) 1-0.5 MG per tablet Take 1 tablet by mouth daily.   . [DISCONTINUED] ranitidine (ZANTAC) 150 MG tablet Take 150 mg by mouth 2 (two) times daily.    No facility-administered encounter medications on file as of 03/19/2016.     Allergies as of 03/19/2016 - Review Complete 03/19/2016  Allergen Reaction Noted  . Codeine  12/16/2007  . Gabapentin  08/30/2014    Past Medical History:  Diagnosis Date  . Anemia   . Anxiety   . Arthritis   . Chronic headaches   . GERD (gastroesophageal reflux disease)   . Migraines     Past Surgical History:  Procedure Laterality Date  . COLONOSCOPY  2009   normal  . ESOPHAGOGASTRODUODENOSCOPY  2009   esophagitis   . Mole on back     removed  . Mole on leg Left    removed  . NASAL SINUS SURGERY    . RECONSTRUCTION OF NOSE    . TONSILLECTOMY      Family History  Problem Relation Age of Onset  . Prostate cancer Maternal Uncle   . Diabetes Maternal  Grandmother   . Heart disease Paternal Grandfather     Social History   Social History  . Marital status: Divorced    Spouse name: N/A  . Number of children: N/A  . Years of education: N/A   Occupational History  . Teacher    Social History Main Topics  . Smoking status: Never Smoker  . Smokeless tobacco: Never Used  . Alcohol use No  . Drug use: No  . Sexual activity: Not on file   Other Topics Concern  . Not on file   Social History Narrative  . No narrative on file      Review of systems: Review of Systems  Constitutional: Negative for fever and chills.  HENT: Negative.   Eyes: Negative for blurred vision.  Respiratory: Negative for cough, shortness of breath and wheezing.     Cardiovascular: Negative for chest pain and palpitations.  Gastrointestinal: as per HPI Genitourinary: Negative for dysuria, urgency, frequency and hematuria.  Musculoskeletal: Negative for myalgias, back pain and joint pain.  Skin: Negative for itching and rash.  Neurological: Negative for dizziness, tremors, focal weakness, seizures and loss of consciousness.  Endo/Heme/Allergies: Positive for seasonal allergies.  Psychiatric/Behavioral: Negative for depression, suicidal ideas and hallucinations.  All other systems reviewed and are negative.   Physical Exam: Vitals:   03/19/16 1011  BP: 112/60  Pulse: 96   Body mass index is 33.2 kg/m. Gen:      No acute distress HEENT:  EOMI, sclera anicteric Neck:     No masses; no thyromegaly Lungs:    Clear to auscultation bilaterally; normal respiratory effort CV:         Regular rate and rhythm; no murmurs Abd:      + bowel sounds; soft, non-tender; no palpable masses, no distension Ext:    No edema; adequate peripheral perfusion Skin:      Warm and dry; no rash Neuro: alert and oriented x 3 Psych: normal mood and affect  Data Reviewed:  Reviewed labs, radiologic imaging, old records and pertinent past GI work up    Assessment and Plan/Recommendations: 71 yr F with chronic GERD and constipation here with worsening symptoms Advised patient to take Prevacid 30 mins before breakfast and dinner (she was taking after meals) and additional H2blocker at bedtime for nocturnal symptoms Will schedule for egd for evaluation of worsening GERD symptoms The risks and benefits as well as alternatives of endoscopic procedure(s) have been discussed and reviewed. All questions answered. The patient agrees to proceed. Start Linzess low dose 72 mcg daily and titrate up if no improvement Start Probiotic VSL#3 rs  1 capsule daily Return as needed  25 minutes was spent face-to-face with the patient. Greater than 50% of the time used for counseling as  well as treatment plan and follow-up. She had multiple questions which were answered to her satisfaction  K. Denzil Magnuson , MD [phone] Mon-Fri 8a-5p [phone] after 5p, weekends, holidays  CC: Virl Son., MD

## 2016-03-24 ENCOUNTER — Encounter: Payer: Self-pay | Admitting: Gastroenterology

## 2016-04-01 ENCOUNTER — Telehealth: Payer: Self-pay | Admitting: Gastroenterology

## 2016-04-01 NOTE — Telephone Encounter (Signed)
Patient was at [facility]. 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 She is home now. Her doctor recommended she put off the October procedure that was scheduled, but did not indicate when it should be reconsidered or scheduled.

## 2016-04-03 NOTE — Telephone Encounter (Signed)
Agree with rescheduling the procedure, we can check back in 3 months

## 2016-04-03 NOTE — Telephone Encounter (Signed)
Left message on machine to call back  

## 2016-04-04 ENCOUNTER — Encounter: Payer: BC Managed Care – PPO | Admitting: Gastroenterology

## 2016-04-04 NOTE — Telephone Encounter (Signed)
Patient is aware of the plan. Added a recall for the procedure.

## 2016-04-29 ENCOUNTER — Telehealth: Payer: Self-pay | Admitting: Gastroenterology

## 2016-04-30 NOTE — Telephone Encounter (Signed)
Spoke with Jennifer Buckley. Overall, she is doing better on Linzess. She is having normal daily bowel movements. She drinks Kerfir most days of the week. She was unable to afford the VSL #3. She has started having issues with intestinal gas through out the day. No grumbling or diarrhea. She has changed to a rice cereal without help. She states her bowel movements and gas smell very foul. Different from a month ago.

## 2016-05-01 NOTE — Telephone Encounter (Signed)
Patient is advised 21 sample capsules of Align probiotic at the front desk for the patient to pick up.

## 2016-05-01 NOTE — Telephone Encounter (Signed)
She can try Align 1 capsule daily instead, can we please get her some samples to try. IB guard 1 capsule BID as needed. Thanks

## 2016-05-07 ENCOUNTER — Telehealth: Payer: Self-pay | Admitting: Gastroenterology

## 2016-05-08 NOTE — Telephone Encounter (Signed)
Patient says the doctor did say she should not come off of Coumadin. She is aware bridging may be necessary. She has an appointment with Dr Silverio Decamp 06/25/16.

## 2016-05-23 ENCOUNTER — Encounter: Payer: BC Managed Care – PPO | Admitting: Gastroenterology

## 2016-05-27 ENCOUNTER — Telehealth: Payer: Self-pay | Admitting: Gastroenterology

## 2016-05-27 NOTE — Telephone Encounter (Signed)
Patient agrees to this plan of care. She will double the 72 mcg dose that hse has because she has "a lot" of them. Follow up by phone.

## 2016-05-27 NOTE — Telephone Encounter (Signed)
Please send Rx for Linzess 137mcg daily, advise patient to take it before breakfast. We can hold off bowel purge for now but if patient feels she has large stool burden ok to go ahead with bowel purge 1 bottle Miralax & Gatorade 64 oz

## 2016-05-27 NOTE — Telephone Encounter (Signed)
Patient was having good bowel movements with Linzess 72 mcg. Then she felt her bowels were becoming sluggish again, so she added Colace daily. She is taking 2 colace daily now with her Linzess and feels it is ineffective. She has small bowel movements QOD. She was recently in the [facility] ED because she was having pain. She was told she has constipation. She has not taken a bowel purge. The pain stopped on it's own.  Does she need to increase Linzess? Should she purge?

## 2016-05-30 ENCOUNTER — Telehealth: Payer: Self-pay | Admitting: Gastroenterology

## 2016-05-30 NOTE — Telephone Encounter (Signed)
Patient instructed about a Miralax purge. She will continue her Linzess but stop for a day if she develops diarrhea

## 2016-06-10 ENCOUNTER — Telehealth: Payer: Self-pay | Admitting: Gastroenterology

## 2016-06-10 MED ORDER — LINACLOTIDE 72 MCG PO CAPS: 72.0000 ug | ORAL_CAPSULE | Freq: Two times a day (BID) | ORAL | 6 refills | Status: DC

## 2016-06-10 NOTE — Telephone Encounter (Signed)
Called patient, she wanted to be sure we sent in 72 mcg twice a day. Dr Silverio Decamp had increased the dose

## 2016-06-17 ENCOUNTER — Other Ambulatory Visit: Payer: Self-pay | Admitting: Gastroenterology

## 2016-06-25 ENCOUNTER — Ambulatory Visit: Payer: BC Managed Care – PPO | Admitting: Gastroenterology

## 2016-06-30 ENCOUNTER — Telehealth: Payer: Self-pay | Admitting: Gastroenterology

## 2016-06-30 NOTE — Telephone Encounter (Signed)
A anti-coag letter faxed to her PCP requesting to stop the Eliquis for 2 days prior to her EGD. Patient is aware that this has to be done with permission from her PCP.

## 2016-06-30 NOTE — Telephone Encounter (Signed)
Her original office appointment was cancelled by the office. Now her appointment is 2 days from when the procedure is scheduled. She is on Eliquis. She is calling to ask if she can have her colonoscopy when she has her EGD. Advised it may not be possible due to the amount of time needed. The EGD may need to be rescheduled due to the lack of time to get permission from the PCP to hold the Eliquis. Patient voices understanding.

## 2016-07-02 ENCOUNTER — Telehealth: Payer: Self-pay

## 2016-07-02 ENCOUNTER — Ambulatory Visit (INDEPENDENT_AMBULATORY_CARE_PROVIDER_SITE_OTHER): Payer: BC Managed Care – PPO | Admitting: Gastroenterology

## 2016-07-02 ENCOUNTER — Encounter: Payer: Self-pay | Admitting: Gastroenterology

## 2016-07-02 VITALS — BP 114/74 | HR 120 | Ht 68.0 in | Wt 224.5 lb

## 2016-07-02 DIAGNOSIS — R194 Change in bowel habit: Secondary | ICD-10-CM | POA: Diagnosis not present

## 2016-07-02 DIAGNOSIS — R1013 Epigastric pain: Secondary | ICD-10-CM | POA: Insufficient documentation

## 2016-07-02 DIAGNOSIS — Z7901 Long term (current) use of anticoagulants: Secondary | ICD-10-CM | POA: Diagnosis not present

## 2016-07-02 DIAGNOSIS — K219 Gastro-esophageal reflux disease without esophagitis: Secondary | ICD-10-CM | POA: Diagnosis not present

## 2016-07-02 DIAGNOSIS — I2699 Other pulmonary embolism without acute cor pulmonale: Secondary | ICD-10-CM

## 2016-07-02 MED ORDER — NA SULFATE-K SULFATE-MG SULF 17.5-3.13-1.6 GM/177ML PO SOLN: 1.0000 | Freq: Once | ORAL | 0 refills | Status: AC

## 2016-07-02 NOTE — Patient Instructions (Signed)
You have been scheduled for an endoscopy and colonoscopy. Please follow the written instructions given to you at your visit today. Please pick up your prep supplies at the pharmacy within the next 1-3 days. If you use inhalers (even only as needed), please bring them with you on the day of your procedure. Your physician has requested that you go to www.startemmi.com and enter the access code given to you at your visit today. This web site gives a general overview about your procedure. However, you should still follow specific instructions given to you by our office regarding your preparation for the procedure.  Start a fiber supplement such as Citracel or Benefiber daily to bowel regimen.

## 2016-07-02 NOTE — Telephone Encounter (Signed)
Spoke with Inez Catalina and explained that we faxed a anticoagulant letter to Dr. Olevia Bowens to have patient hold Eliquis 48 hours prior to her Endoscopy on 07/16/16 and Colonoscopy on 07/18/16 and may need Lovenox bridge since her recent PE. Inez Catalina states she will discuss this with Dr. Olevia Bowens and call me tomorrow after the patient sees Dr. Olevia Bowens in the office.

## 2016-07-02 NOTE — Progress Notes (Signed)
07/02/2016 Jennifer Buckley [id number] [birth year]   History of Present Illness:  This is a 50 year old female who is known to Dr. Silverio Decamp.  She has chronic GERD and chronic constipation. Was seen in August and scheduled for an endoscopy for persistent/worsening GERD symptoms despite her current regimen of Prevacid 30 mg twice daily and ranitidine 300 mg at bedtime. Then, she developed pulmonary embolism in September around the time of her endoscopy and it was canceled. She is now anticoagulated on Eliquis BID.  She comes in today wanting to consider and reschedule her endoscopy. She says that she just is still having a lot of issues with her reflux and epigastric abdominal discomfort. Says that if she misses one dose of her medication that her symptoms recur.  She also wants to discuss a colonoscopy as well.  Her last colonoscopy was in March 2009 and it was normal at that time. She reports that she thinks her "bowels are all messed up". She tends to be constipated, but then walks a fine line between having loose stools if she takes stuff to help her go. She says that she cannot find any "happy medium". She complains of a burning discomfort in her lower abdomen as well that she is concerned about. She is currently on Linzess 72 g 2 of those daily.  I advised her that that they will likely not want to take her off of her Eliquis so soon. She says that it previously was discussed with them about doing a Lovenox bridge. She would like to consider that and have both of these procedures performed by the end of the year if possible due to insurance.   Current Medications, Allergies, Past Medical History, Past Surgical History, Family History and Social History were reviewed in [employer] record.   Physical Exam: BP 114/74   Pulse (!) 120   Ht 5\' 8"  (1.727 m)   Wt 224 lb 8 oz (101.8 kg)   BMI 34.14 kg/m  General: Well developed white female in no acute distress Head:  Normocephalic and atraumatic Eyes:  Sclerae anicteric, conjunctiva pink  Ears: Normal auditory acuity Lungs: Clear throughout to auscultation.  No increased WOB. Heart:  Slightly tachy.  No M/R/G. Abdomen: Soft, non-distended. Normal bowel sounds.  Epigastric TTP. Rectal:  Will be done at the time of colonoscopy. Musculoskeletal: Symmetrical with no gross deformities  Extremities: No edema  Neurological: Alert oriented x 4, grossly non-focal Psychological:  Alert and cooperative. Normal mood and affect  Assessment and Recommendations: -GERD/epigastric pain, worsening/still persistent despite treatment. -Change in bowel habits:  Constipation predominant, but battles with too loose of stools when she takes medication for constipation.  Will continue Linzess 72 mcg (two of them daily), but I have also asked her to add daily Benefiber or Citrucel powder to her regimen to try to help bulk the stool. -PE:  In September 2017, thought due to HRT.  On Eliquis BID for chronic anticoagulation.  *This patient was scheduled for an endoscopy in September, but around the same time she developed pulmonary embolism and was placed on Eliquis. She continues on that. She presents to our office today to re-discuss EGD and also possibly colonoscopy as well. I advised her that that they will likely not want to take her off of her Eliquis so soon. She says that it previously was discussed with them about doing a Lovenox bridge. She would like to consider that and have both of these procedures performed by  the end of the year if possible due to insurance. I advised her this may not be feasible with scheduling, etc. She has an appt with her PCP tomorrow and will discuss with them. We have discussed with the nurse, Jacqlyn Larsen, at her PCPs office as well and she is supposed to relay this information to Dr. Olevia Bowens.

## 2016-07-03 ENCOUNTER — Telehealth: Payer: Self-pay | Admitting: *Deleted

## 2016-07-03 NOTE — Telephone Encounter (Signed)
I dont think we should do the procedures on 2 separate days and this would keep her off anticoagulation for prolonged period as a result and increased risk for thrombosis, Will try to coordinate with patient and see if can do both EGD and colonoscopy on same day (Dec 22, 3:30 PM)

## 2016-07-03 NOTE — Telephone Encounter (Signed)
SPOKE WITH PT SHE IS AWARE OF NEW TIMES OF COLONOSCOPY

## 2016-07-03 NOTE — Telephone Encounter (Signed)
Patient rescheduled to 07/18/16 at 3:30pm for both Endoscopy and Colonoscopy. Patient notified and she verbalized understanding.

## 2016-07-03 NOTE — Telephone Encounter (Signed)
Received fax from Dr. Hansel Starling stating patient can hold Eliquis 2 days prior to her Endoscopy and Colonoscopy. Called Dr. Hansel Starling office and explained to [facility] that she will be off her Eliquis for a total of 5 days since her procedures are on separate days. Jacqlyn Larsen states that Dr. Olevia Bowens is aware of this but will discuss it today with the patient. I called patient to inform her to hold Elqiuis 2 days prior to her Endoscopy on 07/16/16 and Colonoscopy on 07/18/16 which is a total of 5 days per Dr. Olevia Bowens. Patient verbalized understanding. Told patient to call me after she sees Dr. Olevia Bowens if their are any changes.    FYI Dr. Silverio Decamp, just wanted to make you aware.

## 2016-07-04 ENCOUNTER — Encounter: Payer: BC Managed Care – PPO | Admitting: Gastroenterology

## 2016-07-09 NOTE — Progress Notes (Signed)
Reviewed and agree with documentation and assessment and plan. K. Veena Nandigam , MD   

## 2016-07-11 HISTORY — PX: OTHER SURGICAL HISTORY: SHX169

## 2016-07-16 ENCOUNTER — Encounter: Payer: BC Managed Care – PPO | Admitting: Gastroenterology

## 2016-07-18 ENCOUNTER — Encounter: Payer: BC Managed Care – PPO | Admitting: Gastroenterology

## 2016-07-18 ENCOUNTER — Ambulatory Visit (AMBULATORY_SURGERY_CENTER): Payer: BC Managed Care – PPO | Admitting: Gastroenterology

## 2016-07-18 ENCOUNTER — Encounter: Payer: Self-pay | Admitting: Gastroenterology

## 2016-07-18 VITALS — BP 115/73 | HR 88 | Temp 99.3°F | Resp 14 | Ht 68.0 in | Wt 224.0 lb

## 2016-07-18 DIAGNOSIS — K635 Polyp of colon: Secondary | ICD-10-CM

## 2016-07-18 DIAGNOSIS — R194 Change in bowel habit: Secondary | ICD-10-CM

## 2016-07-18 DIAGNOSIS — D125 Benign neoplasm of sigmoid colon: Secondary | ICD-10-CM

## 2016-07-18 DIAGNOSIS — R1013 Epigastric pain: Secondary | ICD-10-CM

## 2016-07-18 MED ORDER — SODIUM CHLORIDE 0.9 % IV SOLN: 500.0000 mL | INTRAVENOUS | Status: DC

## 2016-07-18 NOTE — Op Note (Signed)
[name] Procedure Date: 07/18/2016 4:02 PM MRN: [id number] Endoscopist: Mauri Pole , MD Age: 50 Referring MD:  Date of Birth: Oct 25, 1965 Gender: Female Account #: [id number] Procedure:                Colonoscopy Indications:              Change in bowel habits, Constipation Medicines:                Monitored Anesthesia Care Procedure:                Pre-Anesthesia Assessment:                           - Prior to the procedure, a History and Physical                            was performed, and patient medications and                            allergies were reviewed. The patient's tolerance of                            previous anesthesia was also reviewed. The risks                            and benefits of the procedure and the sedation                            options and risks were discussed with the patient.                            All questions were answered, and informed consent                            was obtained. Prior Anticoagulants: The patient has                            taken no previous anticoagulant or antiplatelet                            agents. ASA Grade Assessment: II - A patient with                            mild systemic disease. After reviewing the risks                            and benefits, the patient was deemed in                            satisfactory condition to undergo the procedure.                           After obtaining informed consent, the colonoscope  was passed under direct vision. Throughout the                            procedure, the patient's blood pressure, pulse, and                            oxygen saturations were monitored continuously. The                            Model [id number]-[id number]) scope was introduced                            through the anus and advanced to the the cecum,                            identified by  appendiceal orifice and ileocecal                            valve. The colonoscopy was performed without                            difficulty. The patient tolerated the procedure                            well. The quality of the bowel preparation was                            excellent. The ileocecal valve, appendiceal                            orifice, and rectum were photographed. Scope In: 4:19:49 PM Scope Out: 4:34:19 PM Scope Withdrawal Time: 0 hours 9 minutes 37 seconds  Total Procedure Duration: 0 hours 14 minutes 30 seconds  Findings:                 The perianal and digital rectal examinations were                            normal.                           A 5 mm polyp was found in the sigmoid colon. The                            polyp was sessile. The polyp was removed with a                            cold snare. Resection and retrieval were complete.                           Non-bleeding internal hemorrhoids were found during                            retroflexion. The hemorrhoids were small.  The exam was otherwise without abnormality. Complications:            No immediate complications. Estimated Blood Loss:     Estimated blood loss was minimal. Impression:               - One 5 mm polyp in the sigmoid colon, removed with                            a cold snare. Resected and retrieved.                           - Non-bleeding internal hemorrhoids.                           - The examination was otherwise normal. Recommendation:           - Patient has a contact number available for                            emergencies. The signs and symptoms of potential                            delayed complications were discussed with the                            patient. Return to normal activities tomorrow.                            Written discharge instructions were provided to the                            patient.                            - Resume previous diet.                           - Continue present medications.                           - Await pathology results.                           - Repeat colonoscopy in 5-10 years for surveillance                            based on pathology results.                           - Return to GI clinic PRN. Mauri Pole, MD 07/18/2016 4:41:47 PM This report has been signed electronically.

## 2016-07-18 NOTE — Progress Notes (Signed)
Called to room to assist during endoscopic procedure.  Patient ID and intended procedure confirmed with present staff. Received instructions for my participation in the procedure from the performing physician.  

## 2016-07-18 NOTE — Progress Notes (Signed)
A and O x3. Report to RN. Tolerated MAC anesthesia well.Teeth unchanged after procedure. 

## 2016-07-18 NOTE — Patient Instructions (Signed)
Impression/Recommendations:  Polyp handout given to patient. Hemorrhoid handout given to patient.   Repeat colonoscopy in 5-10 years for surveillance.   Date to be determined based on pathology.  YOU HAD AN ENDOSCOPIC PROCEDURE TODAY AT [facility]:   Refer to the procedure report that was given to you for any specific questions about what was found during the examination.  If the procedure report does not answer your questions, please call your gastroenterologist to clarify.  If you requested that your care partner not be given the details of your procedure findings, then the procedure report has been included in a sealed envelope for you to review at your convenience later.  YOU SHOULD EXPECT: Some feelings of bloating in the abdomen. Passage of more gas than usual.  Walking can help get rid of the air that was put into your GI tract during the procedure and reduce the bloating. If you had a lower endoscopy (such as a colonoscopy or flexible sigmoidoscopy) you may notice spotting of blood in your stool or on the toilet paper. If you underwent a bowel prep for your procedure, you may not have a normal bowel movement for a few days.  Please Note:  You might notice some irritation and congestion in your nose or some drainage.  This is from the oxygen used during your procedure.  There is no need for concern and it should clear up in a day or so.  SYMPTOMS TO REPORT IMMEDIATELY:   Following lower endoscopy (colonoscopy or flexible sigmoidoscopy):  Excessive amounts of blood in the stool  Significant tenderness or worsening of abdominal pains  Swelling of the abdomen that is new, acute  Fever of 100F or higher   Following upper endoscopy (EGD)  Vomiting of blood or coffee ground material  New chest pain or pain under the shoulder blades  Painful or persistently difficult swallowing  New shortness of breath  Fever of 100F or higher  Black, tarry-looking stools  For  urgent or emergent issues, a gastroenterologist can be reached at any hour by calling [phone].   DIET:  We do recommend a small meal at first, but then you may proceed to your regular diet.  Drink plenty of fluids but you should avoid alcoholic beverages for 24 hours.  ACTIVITY:  You should plan to take it easy for the rest of today and you should NOT DRIVE or use heavy machinery until tomorrow (because of the sedation medicines used during the test).    FOLLOW UP: Our staff will call the number listed on your records the next business day following your procedure to check on you and address any questions or concerns that you may have regarding the information given to you following your procedure. If we do not reach you, we will leave a message.  However, if you are feeling well and you are not experiencing any problems, there is no need to return our call.  We will assume that you have returned to your regular daily activities without incident.  If any biopsies were taken you will be contacted by phone or by letter within the next 1-3 weeks.  Please call us at [phone] if you have not heard about the biopsies in 3 weeks.    SIGNATURES/CONFIDENTIALITY: You and/or your care partner have signed paperwork which will be entered into your electronic medical record.  These signatures attest to the fact that that the information above on your After Visit Summary has been reviewed  and is understood.  Full responsibility of the confidentiality of this discharge information lies with you and/or your care-partner. 

## 2016-07-18 NOTE — Op Note (Signed)
[facility] Patient Name: Jennifer Buckley Procedure Date: 07/18/2016 4:03 PM MRN: [id number] Endoscopist: Mauri Pole , MD Age: 50 Referring MD:  Date of Birth: 01/21/1966 Gender: Female Account #: [id number] Procedure:                Upper GI endoscopy Indications:              Upper abdominal symptoms that persist despite an                            appropriate trial of therapy Medicines:                Monitored Anesthesia Care Procedure:                Pre-Anesthesia Assessment:                           - Prior to the procedure, a History and Physical                            was performed, and patient medications and                            allergies were reviewed. The patient's tolerance of                            previous anesthesia was also reviewed. The risks                            and benefits of the procedure and the sedation                            options and risks were discussed with the patient.                            All questions were answered, and informed consent                            was obtained. Prior Anticoagulants: The patient has                            taken no previous anticoagulant or antiplatelet                            agents. ASA Grade Assessment: II - A patient with                            mild systemic disease. After reviewing the risks                            and benefits, the patient was deemed in                            satisfactory condition to undergo the procedure.  After obtaining informed consent, the endoscope was                            passed under direct vision. Throughout the                            procedure, the patient's blood pressure, pulse, and                            oxygen saturations were monitored continuously. The                            Model GIF-[id number]) scope was introduced                            through the mouth, and  advanced to the second part                            of duodenum. The upper GI endoscopy was                            accomplished without difficulty. The patient                            tolerated the procedure well. Scope In: Scope Out: Findings:                 The esophagus was normal.                           The stomach was normal.                           The examined duodenum was normal. Complications:            No immediate complications. Estimated Blood Loss:     Estimated blood loss: none. Impression:               - Normal esophagus.                           - Normal stomach.                           - Normal examined duodenum.                           - No specimens collected. Recommendation:           - Patient has a contact number available for                            emergencies. The signs and symptoms of potential                            delayed complications were discussed with the  patient. Return to normal activities tomorrow.                            Written discharge instructions were provided to the                            patient.                           - Resume previous diet.                           - Continue present medications.                           - No repeat upper endoscopy.                           - Return to GI office PRN. Mauri Pole, MD 07/18/2016 4:39:37 PM This report has been signed electronically.

## 2016-07-22 ENCOUNTER — Telehealth: Payer: Self-pay | Admitting: *Deleted

## 2016-07-22 NOTE — Telephone Encounter (Signed)
  Follow up Call-  Call back number 07/18/2016  Post procedure Call Back phone  # [phone]  Permission to leave phone message Yes  Some recent data might be hidden     Patient questions:  Do you have a fever, pain , or abdominal swelling? No. Pain Score  0 *  Have you tolerated food without any problems? Yes.    Have you been able to return to your normal activities? Yes.    Do you have any questions about your discharge instructions: Diet   No. Medications  No. Follow up visit  No.  Do you have questions or concerns about your Care? No.  Actions: * If pain score is 4 or above: No action needed, pain <4.

## 2016-07-24 ENCOUNTER — Encounter: Payer: Self-pay | Admitting: Gastroenterology

## 2016-09-27 ENCOUNTER — Other Ambulatory Visit: Payer: Self-pay | Admitting: Gastroenterology

## 2017-01-01 ENCOUNTER — Other Ambulatory Visit: Payer: Self-pay | Admitting: Gastroenterology

## 2017-01-05 ENCOUNTER — Other Ambulatory Visit: Payer: Self-pay | Admitting: Gastroenterology

## 2017-02-02 ENCOUNTER — Other Ambulatory Visit: Payer: Self-pay | Admitting: Gastroenterology

## 2017-03-04 ENCOUNTER — Other Ambulatory Visit: Payer: Self-pay | Admitting: Gastroenterology

## 2017-04-01 ENCOUNTER — Encounter: Payer: Self-pay | Admitting: Gastroenterology

## 2017-04-15 ENCOUNTER — Other Ambulatory Visit: Payer: Self-pay | Admitting: Gastroenterology

## 2017-05-18 ENCOUNTER — Other Ambulatory Visit: Payer: Self-pay | Admitting: Gastroenterology

## 2017-06-03 ENCOUNTER — Ambulatory Visit: Payer: BC Managed Care – PPO | Admitting: Gastroenterology

## 2017-06-03 ENCOUNTER — Encounter: Payer: Self-pay | Admitting: Gastroenterology

## 2017-06-03 VITALS — BP 92/60 | HR 80 | Ht 67.0 in | Wt 218.0 lb

## 2017-06-03 DIAGNOSIS — K5909 Other constipation: Secondary | ICD-10-CM

## 2017-06-03 DIAGNOSIS — R197 Diarrhea, unspecified: Secondary | ICD-10-CM | POA: Diagnosis not present

## 2017-06-03 DIAGNOSIS — K219 Gastro-esophageal reflux disease without esophagitis: Secondary | ICD-10-CM

## 2017-06-03 MED ORDER — DEXLANSOPRAZOLE 30 MG PO CPDR: 30.0000 mg | DELAYED_RELEASE_CAPSULE | Freq: Every day | ORAL | 3 refills | Status: DC

## 2017-06-03 MED ORDER — LUBIPROSTONE 8 MCG PO CAPS: 8.0000 ug | ORAL_CAPSULE | Freq: Two times a day (BID) | ORAL | 3 refills | Status: DC

## 2017-06-03 NOTE — Progress Notes (Signed)
Jennifer Buckley    [id number]    1966/01/24  Primary Care Physician:Furr, Cleone Slim., MD  Referring Physician: V[place]., [place] [address]  [place], [place] [address]  Chief complaint: Constipation  HPI:  51 year old female here for follow-up visit with complaints of persistent constipation alternating with diarrhea.  Patient is extremely stressed with her job and she feels that stress is playing a major role with her symptoms.  She is currently taking Prevacid with good control of GERD symptoms with occasional breakthrough heartburn once or twice a week.  She was given samples of Dexilant by PMD and she felt it was more effective and controlled heartburn better with no breakthrough symptoms.  Due to her work schedule she has to time the [place] and feel its either not effective or causes diarrhea.Denies any nausea, vomiting, abdominal pain, melena or bright red blood per rectum    Outpatient Encounter Medications as of 06/03/2017  Medication Sig  . ALPRAZolam (XANAX) 1 MG tablet Take 1 mg by mouth 3 (three) times daily as needed.   Marland Kitchen aspirin EC 81 MG tablet Take 81 mg daily by mouth.  . botulinum toxin Type A (BOTOX) 100 units SOLR injection Inject 100 Units into the muscle once. For migraines every 3 months  . cetirizine (ZYRTEC) 10 MG tablet Take 10 mg by mouth daily.  . cyclobenzaprine (FLEXERIL) 10 MG tablet Take 10 mg by mouth 3 (three) times daily as needed for muscle spasms.  . fexofenadine (ALLEGRA) 180 MG tablet Take by mouth daily.  . fluticasone (FLONASE) 50 MCG/ACT nasal spray Place 2 sprays into both nostrils 2 (two) times daily.   . hydrOXYzine (ATARAX/VISTARIL) 25 MG tablet Take 25 mg daily by mouth.  . Ketorolac Tromethamine (SPRIX NA) Place into the nose as needed.   . lansoprazole (PREVACID) 30 MG capsule TAKE 1 CAPSULE BY MOUTH 2 TIMES DAILY  . LINZESS 72 MCG capsule TAKE 1 CAPSULE BY MOUTH 2 TIMES DAILY  . montelukast (SINGULAIR) 10 MG  tablet Take 10 mg by mouth daily.   . naproxen (NAPROSYN) 500 MG tablet Take 500 mg 2 (two) times daily with a meal by mouth.  Marland Kitchen PARoxetine (PAXIL-CR) 37.5 MG 24 hr tablet Take 37.5 mg daily by mouth.  . pramipexole (MIRAPEX) 0.25 MG tablet Take 0.25-0.5 mg by mouth at bedtime.   . ranitidine (ZANTAC) 300 MG tablet TAKE 1 TABLET BY MOUTH EVERY NIGHT AT BEDTIME  . [DISCONTINUED] doxepin (SINEQUAN) 25 MG capsule Take 50 mg by mouth daily.  . [DISCONTINUED] DULoxetine (CYMBALTA) 60 MG capsule Take 60 mg by mouth daily.  . [DISCONTINUED] ELIQUIS 5 MG TABS tablet 2 (two) times daily.  . [DISCONTINUED] lansoprazole (PREVACID) 30 MG capsule TAKE 1 CAPSULE BY MOUTH 2 TIMES DAILY (Patient not taking: Reported on 06/03/2017)   Facility-Administered Encounter Medications as of 06/03/2017  Medication  . 0.9 %  sodium chloride infusion    Allergies as of 06/03/2017 - Review Complete 06/03/2017  Allergen Reaction Noted  . Prednisone Other (See Comments) 09/01/2014  . Codeine  12/16/2007  . Gabapentin  08/30/2014    Past Medical History:  Diagnosis Date  . Anemia   . Anxiety   . Arthritis   . Chronic headaches   . GERD (gastroesophageal reflux disease)   . Migraines   . Pulmonary embolism [facility])     Past Surgical History:  Procedure Laterality Date  . COLONOSCOPY  2009  normal  . ESOPHAGOGASTRODUODENOSCOPY  2009   esophagitis   . excision of ingrown toenails Bilateral 07/11/2016   Excision of bilateral great toes  . Mole on back     removed  . Mole on leg Left    removed  . NASAL SINUS SURGERY    . RECONSTRUCTION OF NOSE    . TONSILLECTOMY      Family History  Problem Relation Age of Onset  . Prostate cancer Maternal Uncle   . Diabetes Maternal Grandmother   . Heart disease Maternal Grandmother   . Heart disease Paternal Grandfather   . Hypertension Mother   . Heart disease Father   . Colon cancer Neg Hx   . Stomach cancer Neg Hx   . Esophageal cancer Neg Hx   . Rectal  cancer Neg Hx   . Liver cancer Neg Hx     Social History   Socioeconomic History  . Marital status: Divorced    Spouse name: Not on file  . Number of children: 0  . Years of education: Not on file  . Highest education level: Not on file  Social Needs  . Financial resource strain: Not on file  . Food insecurity - worry: Not on file  . Food insecurity - inability: Not on file  . Transportation needs - medical: Not on file  . Transportation needs - non-medical: Not on file  Occupational History  . Occupation: Pharmacist, hospital    Comment: high school   Tobacco Use  . Smoking status: Never Smoker  . Smokeless tobacco: Never Used  Substance and Sexual Activity  . Alcohol use: Yes    Comment: wine   . Drug use: No  . Sexual activity: Yes    Partners: Male  Other Topics Concern  . Not on file  Social History Narrative  . Not on file      Review of systems: Review of Systems  Constitutional: Negative for fever and chills. Positive for lack of energy and unexplained weight gain HENT: Positive for sinus problem   Eyes: Negative for blurred vision.  Respiratory: Negative for cough, shortness of breath and wheezing.   Cardiovascular: Negative for chest pain and palpitations.  Gastrointestinal: as per HPI Genitourinary: Negative for dysuria, urgency, frequency and hematuria.  Musculoskeletal: Positive for myalgias, back pain and joint pain.  Skin: Negative for itching and rash.  Neurological: Negative for dizziness, tremors, focal weakness, seizures and loss of consciousness.  Endo/Heme/Allergies: Positive for seasonal allergies.  Psychiatric/Behavioral: Negative for depression, suicidal ideas and hallucinations. Positive for anxiety All other systems reviewed and are negative.   Physical Exam: Vitals:   06/03/17 1039  BP: 92/60  Pulse: 80   Body mass index is 34.14 kg/m. Gen:      No acute distress HEENT:  EOMI, sclera anicteric Neck:     No masses; no thyromegaly Lungs:     Clear to auscultation bilaterally; normal respiratory effort CV:         Regular rate and rhythm; no murmurs Abd:      + bowel sounds; soft, non-tender; no palpable masses, no distension Ext:    No edema; adequate peripheral perfusion Skin:      Warm and dry; no rash Neuro: alert and oriented x 3 Psych: normal mood and affect  Data Reviewed:  Reviewed labs, radiology imaging, old records and pertinent past GI work up   Assessment and Plan/Recommendations:  51 year old female with history of chronic constipation alternating with diarrhea and chronic GERD here  for follow-up visit  Constipation: Advised patient to increase dietary fiber and fluid intake Will switch from Linzess to Amitiza 8 mcg twice daily, if not effective can consider increasing his dose to 24 mcg twice daily  GERD: Patient had better control of heartburn with Dexilant Will send prescription for Dexilant 30 mg daily Discussed antireflux measures in detail EGD December 2017 was unremarkable  Colorectal cancer screening: Average risk Last colonoscopy December 2017 with removal of small sigmoid hyperplastic polyp Due for recall colonoscopy in December 2027  Return in 6 months or sooner if needed  25 minutes was spent face-to-face with the patient. Greater than 50% of the time used for counseling as well as treatment plan and follow-up. She had multiple questions which were answered to her satisfaction  K. Denzil Magnuson , MD [phone] Mon-Fri 8a-5p [phone] after 5p, weekends, holidays  CC: Virl Son., MD

## 2017-06-03 NOTE — Patient Instructions (Signed)
We have sent in [place] to your pharmacy  Follow up in 6 months

## 2017-06-10 ENCOUNTER — Other Ambulatory Visit: Payer: Self-pay | Admitting: Gastroenterology

## 2017-06-30 ENCOUNTER — Telehealth: Payer: Self-pay | Admitting: Gastroenterology

## 2017-06-30 ENCOUNTER — Other Ambulatory Visit: Payer: Self-pay

## 2017-06-30 MED ORDER — DEXLANSOPRAZOLE 60 MG PO CPDR: 60.0000 mg | DELAYED_RELEASE_CAPSULE | Freq: Every day | ORAL | 3 refills | Status: DC

## 2017-06-30 NOTE — Telephone Encounter (Signed)
Ok to increase the dose of Dexilant to 60mg  daily to see if it improves symptoms. Will have patient do higher dose for 2 months.

## 2017-06-30 NOTE — Telephone Encounter (Signed)
Patient states she does not think medication dexilant is the right dosage. Patient states she is still nauseous and wants to know if she should try a higher dose

## 2017-06-30 NOTE — Telephone Encounter (Signed)
Spoke to patient, she will try the higher dosage for 2 months and let us know how she is doing. New Rx sent to pharmacy.

## 2017-06-30 NOTE — Telephone Encounter (Signed)
Unable to reach patient, routed to Dr. Silverio Decamp, please advise.

## 2017-09-07 ENCOUNTER — Other Ambulatory Visit: Payer: Self-pay

## 2017-09-07 ENCOUNTER — Telehealth: Payer: Self-pay | Admitting: Gastroenterology

## 2017-09-07 MED ORDER — LUBIPROSTONE 24 MCG PO CAPS: 8.0000 ug | ORAL_CAPSULE | Freq: Two times a day (BID) | ORAL | 6 refills | Status: DC

## 2017-09-07 NOTE — Telephone Encounter (Signed)
Please advise patient  To avoid taking epsom salt. Will increase Amitiza to 10mcg BID, please send a new Rx. If she is having black stool please check BMP and Hgb. Schedule follow up visit.

## 2017-09-07 NOTE — Telephone Encounter (Signed)
This patient had been taken off of Linzess because it was not working daly and when it did, she would have diarrhea. She has been maintained on Amitiza 8 mcg BID. She says it has stopped working over time. She will have 3 to 4 days between bowel movements. When she does have a bowel movement, it starts out hard, then goes to soft. Most recently, she drank Epsom salt solution to create her bowel movement. She was concerned she should not wait so many days between and she had become uncomfortable. She occasionally has "very dark" bowel movements. No longer on Eliquis.  New on Ajovy, Buspar, ASA 81 mg, and sertraline.Take occasional Aleve.

## 2017-09-07 NOTE — Telephone Encounter (Signed)
Patient is in agreement with this plan. She states she is not actively having the dark stools. She will call us if it happens again.

## 2017-10-08 ENCOUNTER — Other Ambulatory Visit: Payer: Self-pay | Admitting: Gastroenterology

## 2017-10-14 ENCOUNTER — Other Ambulatory Visit: Payer: Self-pay | Admitting: Gastroenterology

## 2017-11-16 ENCOUNTER — Ambulatory Visit: Payer: BC Managed Care – PPO | Admitting: Gastroenterology

## 2017-11-16 ENCOUNTER — Encounter: Payer: Self-pay | Admitting: Gastroenterology

## 2017-11-16 VITALS — BP 92/60 | HR 112 | Ht 68.0 in | Wt 210.0 lb

## 2017-11-16 DIAGNOSIS — K581 Irritable bowel syndrome with constipation: Secondary | ICD-10-CM

## 2017-11-16 DIAGNOSIS — K219 Gastro-esophageal reflux disease without esophagitis: Secondary | ICD-10-CM | POA: Diagnosis not present

## 2017-11-16 NOTE — Progress Notes (Signed)
Jennifer Buckley    [id number]    [birth year]  Primary Care Physician:Furr, Cleone Slim., MD  Referring Physician: Karleen Hampshire., MD [place] [address], [place] [address]  Chief complaint: Constipation  HPI: 52 year old female with history of chronic constipation alternating with diarrhea here for follow-up visit.  She gave up drinking soda for lent no longer having diarrhea Amitiza 24 mcg BID, most days has a bowel movement, but doesn't completely evacuate On short term disability for severe Migraine Dexilant is working but cant skip a dose Denies any nausea, vomiting, abdominal pain, melena or bright red blood per rectum    Outpatient Encounter Medications as of 11/16/2017  Medication Sig  . ALPRAZolam (XANAX) 1 MG tablet Take 1 mg by mouth 3 (three) times daily as needed.   . AMBULATORY NON FORMULARY MEDICATION Allergy Shots Once a week  . aspirin EC 81 MG tablet Take 81 mg daily by mouth.  . cetirizine (ZYRTEC) 10 MG tablet Take 10 mg by mouth daily.  . cyclobenzaprine (FLEXERIL) 10 MG tablet Take 10 mg by mouth 3 (three) times daily as needed for muscle spasms.  . DEXILANT 60 MG capsule TAKE 1 CAPSULE BY MOUTH DAILY  . fexofenadine (ALLEGRA) 180 MG tablet Take by mouth daily.  . Fremanezumab-vfrm (AJOVY) 225 MG/1.5ML SOSY Inject into the skin every 30 (thirty) days.  . hydrOXYzine (ATARAX/VISTARIL) 25 MG tablet Take 25 mg daily by mouth.  . lansoprazole (PREVACID) 30 MG capsule TAKE 1 CAPSULE BY MOUTH 2 TIMES DAILY  . lubiprostone (AMITIZA) 24 MCG capsule Take 1 capsule (24 mcg total) by mouth 2 (two) times daily with a meal.  . montelukast (SINGULAIR) 10 MG tablet Take 10 mg by mouth daily.   . naproxen (NAPROSYN) 500 MG tablet Take 500 mg 2 (two) times daily with a meal by mouth.  . pramipexole (MIRAPEX) 0.25 MG tablet Take 0.25-0.5 mg by mouth at bedtime.   . ranitidine (ZANTAC) 300 MG tablet TAKE ONE TABLET BY MOUTH AT BEDTIME  . sertraline (ZOLOFT)  100 MG tablet Take 100 mg by mouth daily.  . [DISCONTINUED] sertraline (ZOLOFT) 20 MG/ML concentrated solution Take 1 tablet by mouth daily.  Marland Kitchen EPINEPHrine 0.3 mg/0.3 mL IJ SOAJ injection Inject 0.3 mg into the muscle once.  . [DISCONTINUED] botulinum toxin Type A (BOTOX) 100 units SOLR injection Inject 100 Units into the muscle once. For migraines every 3 months  . [DISCONTINUED] fluticasone (FLONASE) 50 MCG/ACT nasal spray Place 2 sprays into both nostrils 2 (two) times daily.   . [DISCONTINUED] Ketorolac Tromethamine (SPRIX NA) Place into the nose as needed.   . [DISCONTINUED] PARoxetine (PAXIL-CR) 37.5 MG 24 hr tablet Take 37.5 mg daily by mouth.   Facility-Administered Encounter Medications as of 11/16/2017  Medication  . 0.9 %  sodium chloride infusion    Allergies as of 11/16/2017 - Review Complete 11/16/2017  Allergen Reaction Noted  . Prednisone Other (See Comments) 09/01/2014  . Codeine  12/16/2007  . Gabapentin  08/30/2014    Past Medical History:  Diagnosis Date  . Anemia   . Anxiety   . Arthritis   . Chronic headaches   . GERD (gastroesophageal reflux disease)   . Migraines   . Pulmonary embolism [facility])     Past Surgical History:  Procedure Laterality Date  . COLONOSCOPY  2009   normal  . ESOPHAGOGASTRODUODENOSCOPY  2009   esophagitis   . excision of ingrown  toenails Bilateral 07/11/2016   Excision of bilateral great toes  . Mole on back     removed  . Mole on leg Left    removed  . NASAL SINUS SURGERY    . RECONSTRUCTION OF NOSE    . TONSILLECTOMY      Family History  Problem Relation Age of Onset  . Prostate cancer Maternal Uncle   . Diabetes Maternal Grandmother   . Heart disease Maternal Grandmother   . Heart disease Paternal Grandfather   . Hypertension Mother   . Heart disease Father   . Colon cancer Neg Hx   . Stomach cancer Neg Hx   . Esophageal cancer Neg Hx   . Rectal cancer Neg Hx   . Liver cancer Neg Hx     Social History    Socioeconomic History  . Marital status: Divorced    Spouse name: Not on file  . Number of children: 0  . Years of education: Not on file  . Highest education level: Not on file  Occupational History  . Occupation: Pharmacist, hospital    Comment: high school   Social Needs  . Financial resource strain: Not on file  . Food insecurity:    Worry: Not on file    Inability: Not on file  . Transportation needs:    Medical: Not on file    Non-medical: Not on file  Tobacco Use  . Smoking status: Never Smoker  . Smokeless tobacco: Never Used  Substance and Sexual Activity  . Alcohol use: Yes    Comment: wine   . Drug use: No  . Sexual activity: Yes    Partners: Male  Lifestyle  . Physical activity:    Days per week: Not on file    Minutes per session: Not on file  . Stress: Not on file  Relationships  . Social connections:    Talks on phone: Not on file    Gets together: Not on file    Attends religious service: Not on file    Active member of club or organization: Not on file    Attends meetings of clubs or organizations: Not on file    Relationship status: Not on file  . Intimate partner violence:    Fear of current or ex partner: Not on file    Emotionally abused: Not on file    Physically abused: Not on file    Forced sexual activity: Not on file  Other Topics Concern  . Not on file  Social History Narrative  . Not on file      Review of systems: Review of Systems  Constitutional: Negative for fever and chills.  Positive for lack of energy HENT: Positive for sinus problem Eyes: Negative for blurred vision.  Respiratory: Negative for cough, shortness of breath and wheezing.   Cardiovascular: Negative for chest pain and palpitations.  Gastrointestinal: as per HPI Genitourinary: Negative for dysuria, urgency, frequency and hematuria.  Musculoskeletal: Positive for myalgias, back pain and joint pain.  Skin: Negative for itching and rash.  Neurological: Negative for  dizziness, tremors, focal weakness, seizures and loss of consciousness.  Endo/Heme/Allergies: Positive for seasonal allergies.  Psychiatric/Behavioral: Negative for suicidal ideas and hallucinations.  Positive for depression and anxiety.  Positive for insomnia All other systems reviewed and are negative.   Physical Exam: Vitals:   11/16/17 0844  BP: 92/60  Pulse: (!) 112   Body mass index is 31.93 kg/m. Gen:      No acute distress  HEENT:  EOMI, sclera anicteric Neck:     No masses; no thyromegaly Lungs:    Clear to auscultation bilaterally; normal respiratory effort CV:         Regular rate and rhythm; no murmurs Abd:      + bowel sounds; soft, non-tender; no palpable masses, no distension Ext:    No edema; adequate peripheral perfusion Skin:      Warm and dry; no rash Neuro: alert and oriented x 3 Psych: normal mood and affect  Data Reviewed:  Reviewed labs, radiology imaging, old records and pertinent past GI work up  Colonoscopy July 18, 2016 - One 5 mm polyp in the sigmoid colon, removed with a cold snare. Resected and retrieved.  Hyperplastic polyp - Non-bleeding internal hemorrhoids. - The examination was otherwise normal.  EGD July 18, 2016: Normal  Assessment and Plan/Recommendations:  52 year old female with history of chronic GERD and irritable bowel syndrome predominant constipation here for follow-up visit  GERD: Continue Dexilant and antireflux measures  Irritable bowel syndrome with constipation: Continue Amitiza at 24 mcg twice daily Increase dietary fiber and fluid intake Fiberchoice tablets 1-2 times daily with meals  Due for colorectal cancer screening, recall colonoscopy December 2027  25 minutes was spent face-to-face with the patient. Greater than 50% of the time used for counseling as well as treatment plan and follow-up. She had multiple questions which were answered to her satisfaction  K. Denzil Magnuson , MD [phone]    CC:  Karleen Hampshire., MD

## 2017-11-16 NOTE — Patient Instructions (Signed)
Continue Amitiza 24 mcg twice a day  Use Fiber Choice 1-2 tablets daily or Benefiber 1 teaspoon three times a day with meals  Increase water 10 - 12 cups daily   Follow up in 6 months   If you are age 52 or older, your body mass index should be between 23-30. Your Body mass index is 31.93 kg/m. If this is out of the aforementioned range listed, please consider follow up with your Primary Care Provider.  If you are age 11 or younger, your body mass index should be between 19-25. Your Body mass index is 31.93 kg/m. If this is out of the aformentioned range listed, please consider follow up with your Primary Care Provider.

## 2018-02-23 ENCOUNTER — Other Ambulatory Visit: Payer: Self-pay | Admitting: Gastroenterology

## 2018-03-09 ENCOUNTER — Telehealth: Payer: Self-pay | Admitting: Gastroenterology

## 2018-03-09 DIAGNOSIS — K2100 Gastro-esophageal reflux disease with esophagitis, without bleeding: Secondary | ICD-10-CM

## 2018-03-09 DIAGNOSIS — K209 Esophagitis, unspecified without bleeding: Secondary | ICD-10-CM

## 2018-03-09 NOTE — Telephone Encounter (Signed)
Patient noticed several weeks ago she was having more frequent break through GERD symptoms. Past week has been "a lot worse." She "may have missed a couple of doses of Dexilant and of the Zantac (takes for the histamine blocking). However the past week she has been certain to take the medications consistently as ordered. Symptoms have not improved. She asks for your recommendations.

## 2018-03-10 NOTE — Telephone Encounter (Signed)
Please schedule for Esophageal manometry and 24 ph impedance on PPI to check if she is having persistent acid reflux despite the medication or other etioloy

## 2018-03-11 NOTE — Telephone Encounter (Signed)
Patient agrees to this plan of care. Procedure on 03/22/18 arrive at 12:15 pm.

## 2018-03-19 ENCOUNTER — Other Ambulatory Visit: Payer: Self-pay | Admitting: Gastroenterology

## 2018-03-19 ENCOUNTER — Telehealth: Payer: Self-pay

## 2018-03-19 NOTE — Telephone Encounter (Signed)
The patient reports she is having occasional diarrhea when she takes Mylanta. She is taking Mylanta for reflux and indigestion. She is also on Dexilant. Manometry and [place] study 03/22/18. Resinol ointment given to her to use topically PRN.

## 2018-03-22 ENCOUNTER — Ambulatory Visit (HOSPITAL_COMMUNITY)
Admission: RE | Admit: 2018-03-22 | Discharge: 2018-03-22 | Disposition: A | Discharge status: Home / Self Care | Payer: BC Managed Care – PPO | Source: Ambulatory Visit | Attending: Gastroenterology | Admitting: Gastroenterology

## 2018-03-22 ENCOUNTER — Encounter (HOSPITAL_COMMUNITY)
Admission: RE | Disposition: A | Discharge status: Home / Self Care | Payer: Self-pay | Source: Ambulatory Visit | Attending: Gastroenterology

## 2018-03-22 DIAGNOSIS — R12 Heartburn: Secondary | ICD-10-CM

## 2018-03-22 DIAGNOSIS — K219 Gastro-esophageal reflux disease without esophagitis: Secondary | ICD-10-CM

## 2018-03-22 HISTORY — PX: PH IMPEDANCE STUDY: SHX5565

## 2018-03-22 HISTORY — PX: ESOPHAGEAL MANOMETRY: SHX5429

## 2018-03-22 SURGERY — MANOMETRY, ESOPHAGUS

## 2018-03-22 MED ORDER — LIDOCAINE VISCOUS HCL 2 % MT SOLN
OROMUCOSAL | Status: AC
  Filled 2018-03-22: qty 15

## 2018-03-22 SURGICAL SUPPLY — 2 items
FACESHIELD LNG OPTICON STERILE (SAFETY) IMPLANT
GLOVE BIO SURGEON STRL SZ8 (GLOVE) ×6 IMPLANT

## 2018-03-22 NOTE — Progress Notes (Signed)
Esophageal Manometry done per protocol. Pt tolerated well without distress or complication. Ph with impedance probe placed per protocol. Pt tolerated well. Teaching done with pt regarding ph study and monitor using teach back. Pt voiced understanding. PT to return to unit at 1330 or after tomorrow to have probe removed and monitor downloaded.

## 2018-03-22 NOTE — Progress Notes (Signed)
Pt showed up at [facility] endoscopy 2 1/2 hours early for scheduled manometry and PH testing. Talked with pt. She said the time got confused. She will plan to come back to hospital at 1215 for scheduled test and was going to proceed to another appointment until then.

## 2018-03-24 ENCOUNTER — Encounter (HOSPITAL_COMMUNITY): Payer: Self-pay | Admitting: Gastroenterology

## 2018-03-25 DIAGNOSIS — R12 Heartburn: Secondary | ICD-10-CM

## 2018-03-26 ENCOUNTER — Other Ambulatory Visit: Payer: Self-pay

## 2018-03-26 DIAGNOSIS — K2100 Gastro-esophageal reflux disease with esophagitis, without bleeding: Secondary | ICD-10-CM

## 2018-03-26 MED ORDER — NORTRIPTYLINE HCL 10 MG PO CAPS: 10.0000 mg | ORAL_CAPSULE | Freq: Every day | ORAL | 1 refills | Status: DC

## 2018-03-31 ENCOUNTER — Telehealth: Payer: Self-pay | Admitting: Gastroenterology

## 2018-04-01 NOTE — Telephone Encounter (Signed)
No answer. Did not leave voicemail.

## 2018-06-16 ENCOUNTER — Other Ambulatory Visit: Payer: Self-pay | Admitting: Gastroenterology

## 2018-07-01 ENCOUNTER — Other Ambulatory Visit: Payer: Self-pay | Admitting: Gastroenterology

## 2018-07-28 ENCOUNTER — Emergency Department (HOSPITAL_COMMUNITY)
Admission: EM | Admit: 2018-07-28 | Discharge: 2018-07-28 | Disposition: A | Discharge status: Home / Self Care | Payer: BC Managed Care – PPO | Attending: Emergency Medicine | Admitting: Emergency Medicine

## 2018-07-28 ENCOUNTER — Emergency Department (HOSPITAL_COMMUNITY): Payer: BC Managed Care – PPO

## 2018-07-28 ENCOUNTER — Other Ambulatory Visit: Payer: Self-pay

## 2018-07-28 ENCOUNTER — Encounter (HOSPITAL_COMMUNITY): Payer: Self-pay | Admitting: Emergency Medicine

## 2018-07-28 ENCOUNTER — Telehealth: Payer: Self-pay | Admitting: Physician Assistant

## 2018-07-28 DIAGNOSIS — K224 Dyskinesia of esophagus: Secondary | ICD-10-CM

## 2018-07-28 DIAGNOSIS — Z79899 Other long term (current) drug therapy: Secondary | ICD-10-CM | POA: Diagnosis not present

## 2018-07-28 DIAGNOSIS — K219 Gastro-esophageal reflux disease without esophagitis: Secondary | ICD-10-CM | POA: Diagnosis not present

## 2018-07-28 DIAGNOSIS — R0789 Other chest pain: Secondary | ICD-10-CM | POA: Diagnosis present

## 2018-07-28 DIAGNOSIS — Z7982 Long term (current) use of aspirin: Secondary | ICD-10-CM | POA: Insufficient documentation

## 2018-07-28 LAB — CBC
HCT: 38.8 % (ref 36.0–46.0)
Hemoglobin: 12.1 g/dL (ref 12.0–15.0)
MCH: 27.5 pg (ref 26.0–34.0)
MCHC: 31.2 g/dL (ref 30.0–36.0)
MCV: 88.2 fL (ref 80.0–100.0)
NRBC: 0 % (ref 0.0–0.2)
Platelets: 341 10*3/uL (ref 150–400)
RBC: 4.4 MIL/uL (ref 3.87–5.11)
RDW: 18.2 % — ABNORMAL HIGH (ref 11.5–15.5)
WBC: 9.5 10*3/uL (ref 4.0–10.5)

## 2018-07-28 LAB — POCT I-STAT TROPONIN I: Troponin i, poc: 0 ng/mL (ref 0.00–0.08)

## 2018-07-28 LAB — BASIC METABOLIC PANEL
ANION GAP: 9 (ref 5–15)
BUN: 14 mg/dL (ref 6–20)
CHLORIDE: 106 mmol/L (ref 98–111)
CO2: 25 mmol/L (ref 22–32)
CREATININE: 0.75 mg/dL (ref 0.44–1.00)
Calcium: 9.5 mg/dL (ref 8.9–10.3)
GFR calc non Af Amer: >60 mL/min (ref >60)
GLUCOSE: 103 mg/dL — AB (ref 70–99)
Potassium: 4.2 mmol/L (ref 3.5–5.1)
Sodium: 140 mmol/L (ref 135–145)

## 2018-07-28 MED ORDER — LIDOCAINE VISCOUS HCL 2 % MT SOLN
15.0000 mL | Freq: Once | OROMUCOSAL | Status: AC
  Administered 2018-07-28: 15 mL via ORAL
  Filled 2018-07-28: qty 15

## 2018-07-28 MED ORDER — LIDOCAINE VISCOUS HCL 2 % MT SOLN: 15.0000 mL | OROMUCOSAL | 0 refills | Status: DC | PRN

## 2018-07-28 MED ORDER — ALUM & MAG HYDROXIDE-SIMETH 200-200-20 MG/5ML PO SUSP
30.0000 mL | Freq: Once | ORAL | Status: AC
  Administered 2018-07-28: 30 mL via ORAL
  Filled 2018-07-28: qty 30

## 2018-07-28 NOTE — ED Triage Notes (Signed)
Pt complaint of throat/central chest burning onset last night after eating an apple; hx of GERD but never experienced this severe pain before. Esophageal spasms since event. Speaking complete sentences without difficulty.

## 2018-07-28 NOTE — ED Provider Notes (Signed)
[facility] DEPT Provider Note   CSN: [id number] Arrival date & time: 07/28/18  1027     History   Chief Complaint Chief Complaint  Patient presents with  . Chest Pain    HPI Jennifer Buckley is a 53 y.o. female resenting for evaluation of chest burning and spasm.  Patient states she had a little bit of GERD symptoms last night.  She woke up this morning, she reports lots of chest burning and every time she tries to eat or drink anything, she feels her esophagus spasm.  Patient states this pain is severe.  The burning improved after she took her home Dexilant and ranitidine, which improved the burning.  However, she is still having the spasming feeling.  She denies fevers, chills, sore throat, cough, abdominal pain, urinary symptoms, normal bowel movements.  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  She is not currently on anticoagulation, per her doctor's recommendation.  Other than GERD, she reports no significant medical problems.  HPI  Past Medical History:  Diagnosis Date  . Anemia   . Anxiety   . Arthritis   . Chronic headaches   . GERD (gastroesophageal reflux disease)   . Migraines   . Pulmonary embolism [facility])     Patient Active Problem List   Diagnosis Date Noted  . Heartburn   . Change in bowel habits 07/02/2016  . Abdominal pain, epigastric 07/02/2016  . Chronic anticoagulation 07/02/2016  . Pulmonary embolus ([place]) 07/02/2016  . GERD (gastroesophageal reflux disease) 11/10/2013  . Odynophagia 11/10/2013  . Internal hemorrhoids 10/07/2013  . Hemorrhage of rectum and anus 08/09/2013  . HEADACHE, CHRONIC 12/16/2007  . ANXIETY DEPRESSION 11/20/2007  . GERD 11/20/2007  . TONSILLECTOMY, HX OF 11/20/2007  . ESOPHAGITIS 11/18/2007    Past Surgical History:  Procedure Laterality Date  . COLONOSCOPY  2009   normal  . ESOPHAGEAL MANOMETRY N/A 03/22/2018   Procedure: ESOPHAGEAL MANOMETRY (EM);  Surgeon: Mauri Pole, MD;  Location: [facility] ENDOSCOPY;  Service: Endoscopy;  Laterality: N/A;  . ESOPHAGOGASTRODUODENOSCOPY  2009   esophagitis   . excision of ingrown toenails Bilateral 07/11/2016   Excision of bilateral great toes  . Mole on back     removed  . Mole on leg Left    removed  . NASAL SINUS SURGERY    . [place] IMPEDANCE STUDY N/A 03/22/2018   Procedure: [place] IMPEDANCE STUDY;  Surgeon: Mauri Pole, MD;  Location: [facility] ENDOSCOPY;  Service: Endoscopy;  Laterality: N/A;  . RECONSTRUCTION OF NOSE    . TONSILLECTOMY       OB History   No obstetric history on file.      Home Medications    Prior to Admission medications   Medication Sig Start Date End Date Taking? Authorizing Provider  ALPRAZolam Duanne Moron) 1 MG tablet Take 1 mg by mouth at bedtime.  06/07/13  Yes [provider]  AMBULATORY NON FORMULARY MEDICATION Allergy Shots Once a week   Yes [provider]  AMITIZA 24 MCG capsule TAKE 1 CAPSULE BY MOUTH TWICE A DAY WITHA MEAL Patient taking differently: Take 24 mcg by mouth 2 (two) times daily with a meal.  06/16/18  Yes Nandigam, Kavitha V, MD  aspirin EC 81 MG tablet Take 81 mg daily by mouth.   Yes [provider]  cetirizine (ZYRTEC) 10 MG tablet Take 10 mg by mouth daily.  Yes [provider]  cyclobenzaprine (FLEXERIL) 10 MG tablet Take 10 mg by mouth 3 (three) times daily as needed for muscle spasms.   Yes [provider]  DEXILANT 60 MG capsule TAKE 1 CAPSULE BY MOUTH DAILY 07/01/18  Yes Nandigam, Venia Minks, MD  Galcanezumab-gnlm (EMGALITY) 120 MG/ML SOAJ Inject 120 mg into the skin every 30 (thirty) days.   Yes [provider]  hydrOXYzine (ATARAX/VISTARIL) 25 MG tablet Take 25 mg daily by mouth.   Yes [provider]  Multiple Vitamin (MULTIVITAMIN WITH MINERALS) TABS tablet Take 1 tablet by mouth daily.   Yes [provider]  naproxen  (NAPROSYN) 500 MG tablet Take 500 mg by mouth 2 (two) times daily as needed for moderate pain or headache.    Yes [provider]  nortriptyline (PAMELOR) 10 MG capsule TAKE 1 CAPSULE BY MOUTH EVERY NIGHT AT BEDTIME 07/01/18  Yes Nandigam, Kavitha V, MD  pramipexole (MIRAPEX) 0.25 MG tablet Take 0.75 mg by mouth 2 (two) times daily.  06/18/13  Yes [provider]  ranitidine (ZANTAC) 300 MG tablet TAKE 1 TABLET BY MOUTH EACH NIGHT AT BEDTIME Patient taking differently: Take 300 mg by mouth at bedtime.  02/23/18  Yes Nandigam, Venia Minks, MD  sertraline (ZOLOFT) 100 MG tablet Take 200 mg by mouth daily.    Yes [provider]  EPINEPHrine 0.3 mg/0.3 mL IJ SOAJ injection Inject 0.3 mg into the muscle once.    [provider]  lansoprazole (PREVACID) 30 MG capsule TAKE 1 CAPSULE BY MOUTH 2 TIMES DAILY Patient not taking: No sig reported 05/18/17   Mauri Pole, MD  lidocaine (XYLOCAINE) 2 % solution Use as directed 15 mLs in the mouth or throat as needed for mouth pain. 07/28/18   Brandon Scarbrough, PA-C    Family History Family History  Problem Relation Age of Onset  . Prostate cancer Maternal Uncle   . Diabetes Maternal Grandmother   . Heart disease Maternal Grandmother   . Heart disease Paternal Grandfather   . Hypertension Mother   . Heart disease Father   . Colon cancer Neg Hx   . Stomach cancer Neg Hx   . Esophageal cancer Neg Hx   . Rectal cancer Neg Hx   . Liver cancer Neg Hx     Social History Social History   Tobacco Use  . Smoking status: Never Smoker  . Smokeless tobacco: Never Used  Substance Use Topics  . Alcohol use: Yes    Comment: wine   . Drug use: No     Allergies   Prednisone; Codeine; and Gabapentin   Review of Systems Review of Systems  Gastrointestinal:       Esophageal spasm and burning.  All other systems reviewed and are negative.    Physical Exam Updated Vital Signs BP 122/70 (BP Location: Right Arm)    Pulse 74   Temp 98.7 F (37.1 C) (Oral)   Resp 17   Ht 5\' 7"  (1.702 m)   Wt 93.1 kg   SpO2 100%   BMI 32.14 kg/m   Physical Exam Vitals signs and nursing note reviewed.  Constitutional:      General: She is not in acute distress.    Appearance: She is well-developed.     Comments: Sitting in the bed in no acute distress.  HENT:     Head: Normocephalic and atraumatic.  Eyes:     Conjunctiva/sclera: Conjunctivae normal.     Pupils: Pupils are equal,  round, and reactive to light.  Neck:     Musculoskeletal: Normal range of motion and neck supple.  Cardiovascular:     Rate and Rhythm: Normal rate and regular rhythm.  Pulmonary:     Effort: Pulmonary effort is normal. No respiratory distress.     Breath sounds: Normal breath sounds. No wheezing.  Abdominal:     General: There is no distension.     Palpations: Abdomen is soft. There is no mass.     Tenderness: There is no abdominal tenderness. There is no guarding or rebound.     Comments: No tenderness palpation of the abdomen.  Soft without rigidity, guarding, distention.  Negative rebound.  Musculoskeletal: Normal range of motion.  Skin:    General: Skin is warm and dry.     Capillary Refill: Capillary refill takes less than 2 seconds.  Neurological:     Mental Status: She is alert and oriented to person, place, and time.      ED Treatments / Results  Labs (all labs ordered are listed, but only abnormal results are displayed) Labs Reviewed  BASIC METABOLIC PANEL - Abnormal; Notable for the following components:      Result Value   Glucose, Bld 103 (*)    All other components within normal limits  CBC - Abnormal; Notable for the following components:   RDW 18.2 (*)    All other components within normal limits  I-STAT TROPONIN, ED  POCT I-STAT TROPONIN I    EKG EKG Interpretation  Date/Time:  Wednesday July 28 2018 11:14:51 EST Ventricular Rate:  69 PR Interval:    QRS Duration: 79 QT  Interval:  431 QTC Calculation: 431 R Axis:   23 Text Interpretation:  Sinus rhythm Atrial premature complex Short PR interval Low voltage, precordial leads No old tracing to compare Confirmed by Dorie Rank [id number]) on 07/28/2018 11:17:15 AM   Radiology Dg Chest 2 View  Result Date: 07/28/2018 CLINICAL DATA:  53 year old female with history of central chest burning in throat burning since yesterday evening after eating an apple. EXAM: CHEST - 2 VIEW COMPARISON:  Chest x-ray 12/03/2014. FINDINGS: Lung volumes are normal. Mild linear scarring in the region of the lingula, similar to the prior study. No consolidative airspace disease. No pleural effusions. No pneumothorax. No pulmonary nodule or mass noted. Pulmonary vasculature and the cardiomediastinal silhouette are within normal limits. IMPRESSION: 1. No radiographic evidence of acute cardiopulmonary disease. Electronically Signed   By: Vinnie Langton M.D.   On: 07/28/2018 11:49    Procedures Procedures (including critical care time)  Medications Ordered in ED Medications  alum & mag hydroxide-simeth (MAALOX/MYLANTA) 200-200-20 MG/5ML suspension 30 mL (30 mLs Oral Given 07/28/18 1550)    And  lidocaine (XYLOCAINE) 2 % viscous mouth solution 15 mL (15 mLs Oral Given 07/28/18 1550)     Initial Impression / Assessment and Plan / ED Course  I have reviewed the triage vital signs and the nursing notes.  Pertinent labs & imaging results that were available during my care of the patient were reviewed by me and considered in my medical decision making (see chart for details).     Presenting for evaluation of esophageal spasm and burning.  Physical exam reassuring, she is afebrile not tachycardic and appears nontoxic.  Abdominal exam reassuring.  History of GERD, symptoms sound consistent with GERD, especially as burning improved with reflux medicines.  However, will order further work-up considering worsening symptoms.  Labs reassuring, no  leukocytosis.  Hemoglobin  electrolytes stable.  Troponin negative.  EKG without STEMI.  Chest x-ray viewed interpreted by me, no pneumonia, nontoxic effusion, cardiomegaly, abnormality of the mediastinum, or obvious free air under the diaphragm.  Such, symptoms are likely related to GERD.  Will give viscous lidocaine and Maalox, p.o. challenge, reassess.  On reassessment, patient reports improvement of symptoms.  She is tolerating p.o. without further spasming.  Discussed findings with patient.  Discussed continued use of medication for symptomatic control, and follow-up with GI.  At this time, patient appears safe for discharge.  Return precautions given.  Patient states she understands and agrees to plan.   Final Clinical Impressions(s) / ED Diagnoses   Final diagnoses:  Esophageal spasm  Gastroesophageal reflux disease, esophagitis presence not specified    ED Discharge Orders         Ordered    lidocaine (XYLOCAINE) 2 % solution  As needed     07/28/18 1636           Franchot Heidelberg, PA-C 07/28/18 1929    Dorie Rank, MD 07/31/18 1620

## 2018-07-28 NOTE — Telephone Encounter (Signed)
07/28/2018 10 AM  Patient called our on-call service:  Explained that she had just been to the urgent care with what she describes as a burning and pain in her esophagus.  She was diagnosed with esophageal spasms in urgent care, told they could not do much for her but recommended that she proceed to the ER for further evaluation with an EKG and possibly endoscopy.  Patient tells me she does not feel as though she can make it to next week as she cannot keep down even liquids without vomiting or causing pain.  Explained that she can proceed to [facility] or [facility] ER.  Explained to patient that if she is admitted and they need our assistance they will consult our service.  Ellouise Newer, PA-C [facility]

## 2018-07-28 NOTE — Discharge Instructions (Addendum)
Continue taking your home medications as prescribed.  Use maalox and/or viscous lidocaine as needed for breakthrough symptoms.  Be very careful with your food choices.  Follow up with your GI doctor for further evaluation of your symptoms.  Return to the ER if you have persistent vomiting, high fevers, or any new, concerning, or worsening symptoms.

## 2018-08-03 ENCOUNTER — Ambulatory Visit: Payer: BC Managed Care – PPO | Admitting: Gastroenterology

## 2018-08-03 ENCOUNTER — Other Ambulatory Visit: Payer: Self-pay | Admitting: Gastroenterology

## 2018-08-03 ENCOUNTER — Encounter: Payer: Self-pay | Admitting: Gastroenterology

## 2018-08-03 VITALS — BP 94/60 | HR 91 | Ht 67.0 in | Wt 203.0 lb

## 2018-08-03 DIAGNOSIS — K219 Gastro-esophageal reflux disease without esophagitis: Secondary | ICD-10-CM

## 2018-08-03 DIAGNOSIS — R1310 Dysphagia, unspecified: Secondary | ICD-10-CM

## 2018-08-03 DIAGNOSIS — R0789 Other chest pain: Secondary | ICD-10-CM

## 2018-08-03 DIAGNOSIS — K5909 Other constipation: Secondary | ICD-10-CM | POA: Diagnosis not present

## 2018-08-03 MED ORDER — SUCRALFATE 1 GM/10ML PO SUSP: 1.0000 g | Freq: Three times a day (TID) | ORAL | 0 refills | Status: DC

## 2018-08-03 MED ORDER — LUBIPROSTONE 8 MCG PO CAPS: 8.0000 ug | ORAL_CAPSULE | Freq: Two times a day (BID) | ORAL | 5 refills | Status: DC

## 2018-08-03 MED ORDER — FAMOTIDINE 40 MG PO TABS: 40.0000 mg | ORAL_TABLET | Freq: Every day | ORAL | 5 refills | Status: DC

## 2018-08-03 NOTE — Progress Notes (Signed)
08/03/2018 Jennifer Buckley [id number] [birth year]   HISTORY OF PRESENT ILLNESS: This is a 53 year old female who is a patient of Dr. Woodward Ku.  She has longstanding history of reflux with associated dysphasia and functional chest pain.  She has had extensive evaluation in the past with last EGD in December 2017 that was normal.  In August 2019 she had an esophageal manometry and pH study with impedance that were both also essentially normal.  She has been maintained on Dexilant 60 mg daily and ranitidine 300 mg at bedtime.  Also Dr. Silverio Decamp had started her on nortriptyline 10 mg at bedtime after her last studies in August 2019, but patient has not followed up in our office since that time.  She tells me that she has still continued to take that medication.  Anyway, she presents to our office today with similar complaints as to what she has had in the past.  She says that on New Year's Eve she ate an apple and started having some burning in her esophagus.  The next morning she woke up with terrible burning sensation and what she describes as spasms in her chest.  She says she felt like air was trapped and would come up if she was able to belch.  She went to the emergency department and was given lidocaine and Maalox with improvement in her symptoms.  Labs and chest x-ray were unremarkable at that time.  She is here today for hospital follow-up.  At this point she has experienced continued improvement in her symptoms.  She reports no dysphasia, but occasionally it still is uncomfortable when she swallows food or liquids.  She is also asking to switch her dose of amitiza for her chronic constipation.  She is currently on 24 mcg twice daily, but says that even if she just takes it once daily it tends to cause her too much diarrhea.  Says that even 72 mcg Linzess was too powerful in the past.   Past Medical History:  Diagnosis Date  . Anemia   . Anxiety   . Arthritis   . Chronic headaches   . GERD  (gastroesophageal reflux disease)   . Migraines   . Pulmonary embolism [facility])    Past Surgical History:  Procedure Laterality Date  . COLONOSCOPY  2009   normal  . ESOPHAGEAL MANOMETRY N/A 03/22/2018   Procedure: ESOPHAGEAL MANOMETRY (EM);  Surgeon: Mauri Pole, MD;  Location: [facility];  Service: Endoscopy;  Laterality: N/A;  . ESOPHAGOGASTRODUODENOSCOPY  2009   esophagitis   . excision of ingrown toenails Bilateral 07/11/2016   Excision of bilateral great toes  . Mole on back     removed  . Mole on leg Left    removed  . NASAL SINUS SURGERY    . [place] IMPEDANCE STUDY N/A 03/22/2018   Procedure: [place] IMPEDANCE STUDY;  Surgeon: Mauri Pole, MD;  Location: [facility];  Service: Endoscopy;  Laterality: N/A;  . RECONSTRUCTION OF NOSE    . TONSILLECTOMY      reports that she has never smoked. She has never used smokeless tobacco. She reports current alcohol use. She reports that she does not use drugs. family history includes Diabetes in her maternal grandmother; Heart disease in her father, maternal grandmother, and paternal grandfather; Hypertension in her mother; Prostate cancer in her maternal uncle; Rectal cancer (age of onset: 50) in her maternal aunt. Allergies  Allergen Reactions  . Prednisone Other (See Comments)    "trouble  taking" Can take Methylprednisolone  . Codeine     REACTION: GI UPSET  . Gabapentin     Disoriented      Outpatient Encounter Medications as of 08/03/2018  Medication Sig  . ALPRAZolam (XANAX) 1 MG tablet Take 1 mg by mouth at bedtime.   . AMBULATORY NON FORMULARY MEDICATION Allergy Shots Once a week  . AMITIZA 24 MCG capsule TAKE 1 CAPSULE BY MOUTH TWICE A DAY WITHA MEAL (Patient taking differently: Take 24 mcg by mouth 2 (two) times daily with a meal. )  . aspirin EC 81 MG tablet Take 81 mg daily by mouth.  . cetirizine (ZYRTEC) 10 MG tablet Take 10 mg by mouth daily.  . cyclobenzaprine (FLEXERIL) 10 MG tablet Take 10 mg by mouth 3  (three) times daily as needed for muscle spasms.  . DEXILANT 60 MG capsule TAKE 1 CAPSULE BY MOUTH DAILY  . EPINEPHrine 0.3 mg/0.3 mL IJ SOAJ injection Inject 0.3 mg into the muscle once.  . Galcanezumab-gnlm (EMGALITY) 120 MG/ML SOAJ Inject 120 mg into the skin every 30 (thirty) days.  . hydrOXYzine (ATARAX/VISTARIL) 25 MG tablet Take 25 mg daily by mouth.  . lidocaine (XYLOCAINE) 2 % solution Use as directed 15 mLs in the mouth or throat as needed for mouth pain.  . Multiple Vitamin (MULTIVITAMIN WITH MINERALS) TABS tablet Take 1 tablet by mouth daily.  . naproxen (NAPROSYN) 500 MG tablet Take 500 mg by mouth 2 (two) times daily as needed for moderate pain or headache.   . nortriptyline (PAMELOR) 10 MG capsule TAKE 1 CAPSULE BY MOUTH EVERY NIGHT AT BEDTIME  . pramipexole (MIRAPEX) 0.25 MG tablet Take 0.75 mg by mouth 2 (two) times daily.   . ranitidine (ZANTAC) 300 MG tablet TAKE 1 TABLET BY MOUTH EACH NIGHT AT BEDTIME (Patient taking differently: Take 300 mg by mouth at bedtime. )  . sertraline (ZOLOFT) 100 MG tablet Take 200 mg by mouth daily.   . [DISCONTINUED] lansoprazole (PREVACID) 30 MG capsule TAKE 1 CAPSULE BY MOUTH 2 TIMES DAILY (Patient not taking: No sig reported)  . [DISCONTINUED] 0.9 %  sodium chloride infusion    No facility-administered encounter medications on file as of 08/03/2018.     REVIEW OF SYSTEMS  : All other systems reviewed and negative except where noted in the History of Present Illness.  PHYSICAL EXAM: BP 90/66   Pulse (!) 57   Ht 5\' 7"  (1.702 m)   Wt 203 lb (92.1 kg)   LMP 12/01/2017 (Approximate)   BMI 31.79 kg/m  General: Well developed white female in no acute distress Head: Normocephalic and atraumatic Eyes:  Sclerae anicteric, conjunctiva pink. Ears: Normal auditory acuity  Lungs: Clear throughout to auscultation; no increased WOB. Heart: Regular rate and rhythm; no M/R/G. Abdomen: Soft, non-distended.  BS present.  Non-tender. Musculoskeletal:  Symmetrical with no gross deformities  Skin: No lesions on visible extremities Extremities: No edema  Neurological: Alert oriented x 4, grossly non-focal Psychological:  Alert and cooperative. Normal mood and affect  ASSESSMENT AND PLAN: *GERD, dysphagia, atypical chest pain: All likely secondary to a flare of her reflux.  Already improving since her ED visit.  Continue Dexilant 60 mg daily.  She is on ranitidine 300 mg at bedtime so we will discontinue that and place her on Pepcid 40 mg daily instead.  We will add Carafate suspension AC at bedtime for the next few weeks to see if this gives any added benefit.  Will discuss with Dr. Silverio Decamp  whether to increase her nortriptyline.   *Chronic constipation:  Feels like amitiza 24 mcg even once daily is too much.  Will decrease to 8 mcg BID with food instead.  **We will follow-up with Dr. Silverio Decamp in approximately 1 month.  CC:  Karleen Hampshire., MD

## 2018-08-03 NOTE — Patient Instructions (Addendum)
Stop Amitiza 24 mcg.   We have sent the following medications to your pharmacy for you to pick up at your convenience: Amitiza 8 mcg twice a day with food.   Pepcid 40 mg at bedtime.   Carafate 1 gm with meals and at bedtime.   Stop Zantac.

## 2018-08-03 NOTE — Telephone Encounter (Signed)
Dr Silverio Decamp patient is requesting refills of Nortriptyline we have not seen her since April 2019 and it looks like it was removed off her med list, Can she have refills?

## 2018-08-04 NOTE — Telephone Encounter (Signed)
Ok to send refill for Nortriptyline, please schedule office visit next available.

## 2018-08-05 ENCOUNTER — Telehealth: Payer: Self-pay | Admitting: Gastroenterology

## 2018-08-05 NOTE — Telephone Encounter (Signed)
Patient has a follow up on 2/10

## 2018-08-05 NOTE — Telephone Encounter (Signed)
Pt stated that insurance requires a prior auth for Carafate.  Pt mentions that the generic is covered.  Pt would like a CB to update.

## 2018-08-05 NOTE — Telephone Encounter (Signed)
Sorry, pt states that the pill form not the suspension is covered by ins.

## 2018-08-06 ENCOUNTER — Other Ambulatory Visit: Payer: Self-pay

## 2018-08-06 MED ORDER — SUCRALFATE 1 G PO TABS: 1.0000 g | ORAL_TABLET | Freq: Three times a day (TID) | ORAL | 2 refills | Status: DC

## 2018-08-11 ENCOUNTER — Encounter: Payer: Self-pay | Admitting: Gastroenterology

## 2018-08-11 DIAGNOSIS — R0789 Other chest pain: Secondary | ICD-10-CM | POA: Insufficient documentation

## 2018-08-11 DIAGNOSIS — R1310 Dysphagia, unspecified: Secondary | ICD-10-CM | POA: Insufficient documentation

## 2018-08-11 DIAGNOSIS — K5909 Other constipation: Secondary | ICD-10-CM | POA: Insufficient documentation

## 2018-08-19 NOTE — Progress Notes (Signed)
Reviewed and agree with documentation and assessment and plan. K. Veena Nandigam , MD   

## 2018-09-06 ENCOUNTER — Ambulatory Visit: Payer: BC Managed Care – PPO | Admitting: Gastroenterology

## 2018-10-19 ENCOUNTER — Other Ambulatory Visit: Payer: Self-pay | Admitting: Gastroenterology

## 2018-11-09 ENCOUNTER — Other Ambulatory Visit: Payer: Self-pay | Admitting: Gastroenterology

## 2018-12-06 ENCOUNTER — Other Ambulatory Visit: Payer: Self-pay

## 2018-12-06 ENCOUNTER — Telehealth: Payer: Self-pay | Admitting: Gastroenterology

## 2018-12-06 DIAGNOSIS — K5909 Other constipation: Secondary | ICD-10-CM

## 2018-12-06 MED ORDER — LINACLOTIDE 145 MCG PO CAPS: 145.0000 ug | ORAL_CAPSULE | Freq: Every day | ORAL | Status: DC

## 2018-12-06 NOTE — Telephone Encounter (Signed)
Please send Rx for Linzess 135mcg daily, take 1 capsule daily on empty stomach. Stop Amitiza. Please send 90 days supply. Schedule telemedicine visit in 1 week. Thanks

## 2018-12-06 NOTE — Telephone Encounter (Signed)
Pt called to inform that lower dose of Amitiza is not helping her.  She requested a call back.

## 2018-12-06 NOTE — Telephone Encounter (Signed)
Order put in for [place], called patient and asked her to stop Amitiza (per Dr. Silverio Decamp) and pick-up Linzess. Also scheduled virtual visit for 12/13/18 @9 :15am

## 2018-12-06 NOTE — Telephone Encounter (Signed)
Patient is having trouble with constipation on the lower dose of Amitiza (54mcq BID). She only has a BM once every few days and it is hard. The higher dose of 13mcq BID gave her diarrhea. Please advice

## 2018-12-08 ENCOUNTER — Telehealth: Payer: Self-pay | Admitting: Gastroenterology

## 2018-12-08 MED ORDER — LINACLOTIDE 145 MCG PO CAPS: 145.0000 ug | ORAL_CAPSULE | Freq: Every day | ORAL | 3 refills | Status: DC

## 2018-12-08 NOTE — Telephone Encounter (Signed)
Linzess 145 mcg sent to pharmacy as requested

## 2018-12-08 NOTE — Telephone Encounter (Signed)
Pt requested prescription for Linzess to be sent to [facility].

## 2018-12-13 ENCOUNTER — Ambulatory Visit: Payer: BC Managed Care – PPO | Admitting: Gastroenterology

## 2018-12-27 ENCOUNTER — Encounter: Payer: Self-pay | Admitting: Gastroenterology

## 2018-12-27 ENCOUNTER — Ambulatory Visit (INDEPENDENT_AMBULATORY_CARE_PROVIDER_SITE_OTHER): Payer: BC Managed Care – PPO | Admitting: Gastroenterology

## 2018-12-27 ENCOUNTER — Other Ambulatory Visit: Payer: Self-pay

## 2018-12-27 VITALS — Ht 67.0 in | Wt 203.0 lb

## 2018-12-27 DIAGNOSIS — K581 Irritable bowel syndrome with constipation: Secondary | ICD-10-CM

## 2018-12-27 DIAGNOSIS — K219 Gastro-esophageal reflux disease without esophagitis: Secondary | ICD-10-CM | POA: Diagnosis not present

## 2018-12-27 DIAGNOSIS — K5904 Chronic idiopathic constipation: Secondary | ICD-10-CM | POA: Diagnosis not present

## 2018-12-27 MED ORDER — LINACLOTIDE 145 MCG PO CAPS: ORAL_CAPSULE | ORAL | 3 refills | Status: DC

## 2018-12-27 NOTE — Progress Notes (Signed)
Jennifer Buckley    [id number]    05-06-1966  Primary Care Physician:Furr, Cleone Slim., MD  Referring Physician: Karleen Hampshire., MD [address] [place], [name] [address]  This service was provided via audio and video telemedicine (Doximity) due to [place] 19 pandemic.  Patient location: Home Provider location: Office Used 2 patient identifiers to confirm the correct person. Explained the limitations in evaluation and management via telemedicine. Patient is aware of potential medical charges for this visit.  Patient consented to this virtual visit.  The persons participating in this telemedicine service were myself and the patient  Interactive audio and video telecommunications were attempted between this provider and patient, however failed, due to patient having technical difficulties OR patient did not have access to video capability. We continued and completed visit with audio only.    Chief complaint: Constipation  HPI:  53 year old female with history of chronic IBS predominant constipation for follow-up visit. Last seen April 2019. Amitiza 24 mcg or 8 mcg twice daily no longer working. Either constipation or diarrhea  Linzess 153mcg bowel movement still irregular, has alternating  Constipation and diarrhea associated with bloating and intermittent abdominal cramping. Denies any dysphagia, vomiting, melena or blood per rectum.  No decreased appetite or unintentional weight loss.  Colonoscopy July 18, 2016: Sigmoid colon hyperplastic polyp, internal hemorrhoids  EGD July 18, 2016:Normal  Outpatient Encounter Medications as of 12/27/2018  Medication Sig  . ALPRAZolam (XANAX) 1 MG tablet Take 1 mg by mouth at bedtime.   . AMBULATORY NON FORMULARY MEDICATION Allergy Shots Once a week  . aspirin EC 81 MG tablet Take 81 mg daily by mouth.  . cetirizine (ZYRTEC) 10 MG tablet Take 10 mg by mouth daily.  . cyclobenzaprine (FLEXERIL) 10 MG tablet Take  10 mg by mouth 3 (three) times daily as needed for muscle spasms.  . DEXILANT 60 MG capsule TAKE 1 CAPSULE BY MOUTH DAILY  . EPINEPHrine 0.3 mg/0.3 mL IJ SOAJ injection Inject 0.3 mg into the muscle once.  . escitalopram (LEXAPRO) 10 MG tablet   . famotidine (PEPCID) 40 MG tablet Take 1 tablet (40 mg total) by mouth at bedtime.  . Galcanezumab-gnlm (EMGALITY) 120 MG/ML SOAJ Inject 120 mg into the skin every 30 (thirty) days.  . hydrOXYzine (ATARAX/VISTARIL) 25 MG tablet Take 25 mg daily by mouth.  . linaclotide (LINZESS) 145 MCG CAPS capsule Take 1 capsule (145 mcg total) by mouth daily before breakfast.  . Multiple Vitamin (MULTIVITAMIN WITH MINERALS) TABS tablet Take 1 tablet by mouth daily.  . naproxen (NAPROSYN) 500 MG tablet Take 500 mg by mouth 2 (two) times daily as needed for moderate pain or headache.   . nortriptyline (PAMELOR) 10 MG capsule TAKE 1 CAPSULE BY MOUTH EACH NIGHT AT BEDTIME  . pramipexole (MIRAPEX) 0.25 MG tablet Take 0.75 mg by mouth 2 (two) times daily.   . Rimegepant Sulfate (NURTEC) 75 MG TBDP Take by mouth as needed.  . sucralfate (CARAFATE) 1 g tablet Take 1 tablet (1 g total) by mouth 4 (four) times daily -  with meals and at bedtime. (Patient taking differently: Take 1 g by mouth as needed. )   No facility-administered encounter medications on file as of 12/27/2018.     Allergies as of 12/27/2018 - Review Complete 12/24/2018  Allergen Reaction Noted  . Prednisone Other (See Comments) 09/01/2014  . Codeine  12/16/2007  . Gabapentin  08/30/2014  Past Medical History:  Diagnosis Date  . Anemia   . Anxiety   . Arthritis   . Chronic headaches   . GERD (gastroesophageal reflux disease)   . Migraines   . Pulmonary embolism [facility])     Past Surgical History:  Procedure Laterality Date  . COLONOSCOPY  2009   normal  . ESOPHAGEAL MANOMETRY N/A 03/22/2018   Procedure: ESOPHAGEAL MANOMETRY (EM);  Surgeon: Mauri Pole, MD;  [facility];   Service: Endoscopy;  Laterality: N/A;  . ESOPHAGOGASTRODUODENOSCOPY  2009   esophagitis   . excision of ingrown toenails Bilateral 07/11/2016   Excision of bilateral great toes  . Mole on back     removed  . Mole on leg Left    removed  . NASAL SINUS SURGERY    . [place] IMPEDANCE STUDY N/A 03/22/2018   Procedure: [place] IMPEDANCE STUDY;  Surgeon: Mauri Pole, MD;  Location: [facility];  Service: Endoscopy;  Laterality: N/A;  . RECONSTRUCTION OF NOSE    . TONSILLECTOMY      Family History  Problem Relation Age of Onset  . Prostate cancer Maternal Uncle   . Diabetes Maternal Grandmother   . Heart disease Maternal Grandmother   . Heart disease Paternal Grandfather   . Hypertension Mother   . Heart disease Father   . Rectal cancer Maternal Aunt 72  . Colon cancer Neg Hx   . Stomach cancer Neg Hx   . Esophageal cancer Neg Hx   . Liver cancer Neg Hx     Social History   Socioeconomic History  . Marital status: Divorced    Spouse name: Not on file  . Number of children: 0  . Years of education: Not on file  . Highest education level: Not on file  Occupational History  . Occupation: Pharmacist, hospital    Comment: high school   Social Needs  . Financial resource strain: Not on file  . Food insecurity:    Worry: Not on file    Inability: Not on file  . Transportation needs:    Medical: Not on file    Non-medical: Not on file  Tobacco Use  . Smoking status: Never Smoker  . Smokeless tobacco: Never Used  Substance and Sexual Activity  . Alcohol use: Yes    Comment: wine   . Drug use: No  . Sexual activity: Yes    Partners: Male  Lifestyle  . Physical activity:    Days per week: Not on file    Minutes per session: Not on file  . Stress: Not on file  Relationships  . Social connections:    Talks on phone: Not on file    Gets together: Not on file    Attends religious service: Not on file    Active member of club or organization: Not on file    Attends meetings of clubs  or organizations: Not on file    Relationship status: Not on file  . Intimate partner violence:    Fear of current or ex partner: Not on file    Emotionally abused: Not on file    Physically abused: Not on file    Forced sexual activity: Not on file  Other Topics Concern  . Not on file  Social History Narrative  . Not on file      Review of systems: Review of Systems as per HPI All other systems reviewed and are negative.   Physical Exam: Vitals were not taken and physical  exam was not performed during this virtual visit.  Data Reviewed:  Reviewed labs, radiology imaging, old records and pertinent past GI work up   Assessment and Plan/Recommendations:  53 year old female with irritable bowel syndrome predominant constipation and chronic GERD  Linzess 145 mcg daily, advised patient to take it after meal to increase availability and potency Start Benefiber 1 teaspoon 3 times daily with meals Add MiraLAX if continues to have irregular bowel habits with constipation, start with 1/4th capful and slowly titrate up as needed  Continue Dexilant and antireflux measures for GERD  Follow-up in 2 to 4 weeks  K. Denzil Magnuson , MD   CC: Karleen Hampshire., MD

## 2018-12-27 NOTE — Patient Instructions (Addendum)
Take Linzess 145 mcg daily, after a meal  Start Benefiber 1 teaspoon 3 times daily with meals  If no improvement with bowel habits in 1 week, start MiraLAX one fourth capful or 1 teaspoon at bedtime daily and slowly titrate up as needed to have 1-2 soft bowel movement daily  Continue Dexilant daily  Follow-up televisit in 2 to 4 weeks  I appreciate the  opportunity to care for you  Thank You   Harl Bowie , MD

## 2018-12-29 ENCOUNTER — Encounter: Payer: Self-pay | Admitting: Gastroenterology

## 2019-01-04 ENCOUNTER — Other Ambulatory Visit: Payer: Self-pay | Admitting: Gastroenterology

## 2019-01-26 ENCOUNTER — Ambulatory Visit: Payer: BC Managed Care – PPO | Admitting: Gastroenterology

## 2019-03-10 ENCOUNTER — Other Ambulatory Visit: Payer: Self-pay | Admitting: Gastroenterology

## 2019-06-07 ENCOUNTER — Other Ambulatory Visit: Payer: Self-pay | Admitting: Gastroenterology

## 2019-06-22 ENCOUNTER — Telehealth: Payer: Self-pay | Admitting: Gastroenterology

## 2019-06-22 ENCOUNTER — Other Ambulatory Visit: Payer: Self-pay

## 2019-06-22 MED ORDER — SUCRALFATE 1 GM/10ML PO SUSP: 1.0000 g | Freq: Three times a day (TID) | ORAL | 0 refills | Status: DC

## 2019-06-22 NOTE — Telephone Encounter (Signed)
Spoke with the patient who reports recent back issues for which she was given Naprosyn. She took Naprosyn after a very long car ride to alleviate her back pain. She took it with food. Later she vomited the food. She has had a nauseous burning in her epigastric area since this. She has been taking Gaviscon with little relief. Patient asks if she can have Carafate again. Currently on daily Dexilant.  She would like the liquid and understands this will likely not be covered by her insurance. She said she will use the GoodRx card for a discount. Per Dr Silverio Decamp, Carafate sent to pharmacy of the patient's choice in [place], [place]

## 2019-06-22 NOTE — Telephone Encounter (Signed)
Ok, thanks.

## 2019-08-10 ENCOUNTER — Telehealth: Payer: Self-pay | Admitting: Gastroenterology

## 2019-08-10 MED ORDER — DEXILANT 60 MG PO CPDR: 1.0000 | DELAYED_RELEASE_CAPSULE | Freq: Every day | ORAL | 3 refills | Status: DC

## 2019-08-10 NOTE — Telephone Encounter (Signed)
sent dexilant to patients pharmacy in [place].  Patient aware

## 2019-08-10 NOTE — Telephone Encounter (Signed)
Pt stated that she is out of town and is having trouble refilling dexilant.

## 2019-08-10 NOTE — Telephone Encounter (Signed)
Patient is calling you back in reference to pharmacy

## 2019-08-15 ENCOUNTER — Telehealth: Payer: Self-pay

## 2019-08-15 NOTE — Telephone Encounter (Signed)
Jennifer Remigio KeyDonney Dice -  PA Case [id number]  Outcome  Approved today  Your PA request has been approved. Additional information will be provided in the approval communication. (Message 1145)  DrugDexilant 60MG  dr capsules

## 2019-08-15 NOTE — Telephone Encounter (Signed)
Can you help me with this - see her response to my message that I sent a prior authorization for her Dexilant. Would you like me to try to do a prior auth for Carafate?  I sent this to you because she mentioned you by name!

## 2019-08-15 NOTE — Telephone Encounter (Signed)
Prior authorization for Dexilant submitted to Cover My Meds.  Sent patient a mychart message to tell her that I would let her know when I had a response.

## 2019-08-15 NOTE — Telephone Encounter (Signed)
Pt is calling back about the PA for Dexilant

## 2019-08-16 NOTE — Telephone Encounter (Signed)
Thank you :)

## 2019-08-16 NOTE — Telephone Encounter (Signed)
Thank you Magda Paganini. I have reached out to her to see if she will let me schedule an appointment. If not, then we can try to help get coverage for the Carafate liquid without the updated information.

## 2019-09-08 ENCOUNTER — Other Ambulatory Visit: Payer: Self-pay | Admitting: Gastroenterology

## 2019-09-23 ENCOUNTER — Other Ambulatory Visit: Payer: Self-pay | Admitting: *Deleted

## 2019-09-23 ENCOUNTER — Ambulatory Visit: Payer: BC Managed Care – PPO | Admitting: Gastroenterology

## 2019-09-23 ENCOUNTER — Ambulatory Visit (INDEPENDENT_AMBULATORY_CARE_PROVIDER_SITE_OTHER): Payer: BC Managed Care – PPO | Admitting: Gastroenterology

## 2019-09-23 ENCOUNTER — Encounter: Payer: Self-pay | Admitting: Gastroenterology

## 2019-09-23 VITALS — Wt 202.0 lb

## 2019-09-23 DIAGNOSIS — K2100 Gastro-esophageal reflux disease with esophagitis, without bleeding: Secondary | ICD-10-CM

## 2019-09-23 DIAGNOSIS — K581 Irritable bowel syndrome with constipation: Secondary | ICD-10-CM | POA: Diagnosis not present

## 2019-09-23 MED ORDER — DEXILANT 60 MG PO CPDR: 1.0000 | DELAYED_RELEASE_CAPSULE | Freq: Every day | ORAL | 5 refills | Status: DC

## 2019-09-23 MED ORDER — SUCRALFATE 1 GM/10ML PO SUSP: 1.0000 g | Freq: Three times a day (TID) | ORAL | 1 refills | Status: DC

## 2019-09-23 NOTE — Progress Notes (Signed)
Jennifer Buckley    UB:[id number]    March 15, 1966  Primary Care Physician:Furr, Cleone Slim., MD  Referring Physician: Karleen Hampshire., MD [place] [id number] [place],  [place] [address]  This service was provided via  telemedicine due to [place] 19 pandemic.  I connected with@ on 09/23/19 at 11:00 AM EST by a video enabled telemedicine application and verified that I am speaking with the correct person using two identifiers.  Patient location: Home Provider location: Office   I discussed the limitations, risks, security and privacy concerns of performing an evaluation and management service by video enabled telemedicine application and the availability of in person appointments. I also discussed with the patient that there may be a patient responsible charge related to this service. The patient expressed understanding and agreed to proceed.   The persons participating in this telemedicine service were myself and the patient  Interactive audio and video telecommunications were attempted between this provider and patient, however failed, due to technical difficulties. We continued and completed visit with audio only.    Chief complaint: Constipation, GERD  HPI: 54 year old female here for follow-up visit for gerd and chronic constipation.  Dexilant is working and preventing frequent heartburn.  When insurance didn't approve until prior auth, she had significant heartburn and epigastric discomfort. Used Carafate suspension with some relief Using Linzess 160mcg PRN when she is constipated Denies any dysphagia, vomiting, melena or blood per rectum.  No decreased appetite or unintentional weight loss.  Colonoscopy July 18, 2016: Sigmoid colon hyperplastic polyp, internal hemorrhoids  EGD July 18, 2016:Normal  Outpatient Encounter Medications as of 09/23/2019  Medication Sig  . ALPRAZolam (XANAX) 1 MG tablet Take 1 mg by mouth at bedtime.   Marland Kitchen aspirin EC 81 MG tablet  Take 81 mg daily by mouth.  . baclofen (LIORESAL) 20 MG tablet Take 20 mg by mouth 2 (two) times daily.  . cetirizine (ZYRTEC) 10 MG tablet Take 10 mg by mouth daily.  . cyclobenzaprine (FLEXERIL) 10 MG tablet Take 10 mg by mouth 3 (three) times daily as needed for muscle spasms.  . DEXILANT 60 MG capsule TAKE 1 CAPSULE BY MOUTH DAILY  . escitalopram (LEXAPRO) 10 MG tablet   . LINZESS 145 MCG CAPS capsule TAKE 1 CAPSULE BY MOUTH DAILY BEFORE BREAKFAST (Patient taking differently: as needed. )  . naproxen (NAPROSYN) 500 MG tablet Take 500 mg by mouth 2 (two) times daily as needed for moderate pain or headache.   . pramipexole (MIRAPEX) 0.25 MG tablet Take 0.75 mg by mouth 2 (two) times daily.   . Rimegepant Sulfate (NURTEC) 75 MG TBDP Take by mouth as needed.  . sucralfate (CARAFATE) 1 GM/10ML suspension Take 10 mLs (1 g total) by mouth 4 (four) times daily -  with meals and at bedtime.  . AMBULATORY NON FORMULARY MEDICATION Allergy Shots Once a week  . botulinum toxin Type A (BOTOX) 100 units SOLR injection Botox 100 unit injection  Take by injection route.  Marland Kitchen EPINEPHrine 0.3 mg/0.3 mL IJ SOAJ injection Inject 0.3 mg into the muscle once.  . famotidine (PEPCID) 40 MG tablet Take 1 tablet (40 mg total) by mouth at bedtime. (Patient not taking: Reported on 09/23/2019)  . Galcanezumab-gnlm (EMGALITY) 120 MG/ML SOAJ Inject 120 mg into the skin every 30 (thirty) days.  . hydrOXYzine (ATARAX/VISTARIL) 25 MG tablet Take 25 mg daily by mouth.  . Multiple Vitamin (MULTIVITAMIN WITH MINERALS) TABS tablet Take  1 tablet by mouth daily.  . nortriptyline (PAMELOR) 10 MG capsule TAKE 1 CAPSULE BY MOUTH EACH NIGHT AT BEDTIME (Patient not taking: Reported on 09/23/2019)   No facility-administered encounter medications on file as of 09/23/2019.    Allergies as of 09/23/2019 - Review Complete 09/23/2019  Allergen Reaction Noted  . Prednisone Other (See Comments) 09/01/2014  . Codeine  12/16/2007  . Gabapentin   08/30/2014    Past Medical History:  Diagnosis Date  . Anemia   . Anxiety   . Arthritis   . Chronic headaches   . GERD (gastroesophageal reflux disease)   . Migraines   . Pulmonary embolism [facility])     Past Surgical History:  Procedure Laterality Date  . COLONOSCOPY  2009   normal  . ESOPHAGEAL MANOMETRY N/A 03/22/2018   Procedure: ESOPHAGEAL MANOMETRY (EM);  Surgeon: Mauri Pole, MD;  Location: [facility] ENDOSCOPY;  Service: Endoscopy;  Laterality: N/A;  . ESOPHAGOGASTRODUODENOSCOPY  2009   esophagitis   . excision of ingrown toenails Bilateral 07/11/2016   Excision of bilateral great toes  . Mole on back     removed  . Mole on leg Left    removed  . NASAL SINUS SURGERY    . [place] IMPEDANCE STUDY N/A 03/22/2018   Procedure: [place] IMPEDANCE STUDY;  Surgeon: Mauri Pole, MD;  Location: [facility] ENDOSCOPY;  Service: Endoscopy;  Laterality: N/A;  . RECONSTRUCTION OF NOSE    . TONSILLECTOMY      Family History  Problem Relation Age of Onset  . Prostate cancer Maternal Uncle   . Diabetes Maternal Grandmother   . Heart disease Maternal Grandmother   . Heart disease Paternal Grandfather   . Hypertension Mother   . Heart disease Father   . Rectal cancer Maternal Aunt 72  . Colon cancer Neg Hx   . Stomach cancer Neg Hx   . Esophageal cancer Neg Hx   . Liver cancer Neg Hx     Social History   Socioeconomic History  . Marital status: Divorced    Spouse name: Not on file  . Number of children: 0  . Years of education: Not on file  . Highest education level: Not on file  Occupational History  . Occupation: Pharmacist, hospital    Comment: high school   Tobacco Use  . Smoking status: Never Smoker  . Smokeless tobacco: Never Used  Substance and Sexual Activity  . Alcohol use: Yes    Comment: wine   . Drug use: No  . Sexual activity: Yes    Partners: Male  Other Topics Concern  . Not on file  Social History Narrative  . Not on file   Social Determinants of Health   Financial  Resource Strain:   . Difficulty of Paying Living Expenses: Not on file  Food Insecurity:   . Worried About Charity fundraiser in the Last Year: Not on file  . Ran Out of Food in the Last Year: Not on file  Transportation Needs:   . Lack of Transportation (Medical): Not on file  . Lack of Transportation (Non-Medical): Not on file  Physical Activity:   . Days of Exercise per Week: Not on file  . Minutes of Exercise per Session: Not on file  Stress:   . Feeling of Stress : Not on file  Social Connections:   . Frequency of Communication with Friends and Family: Not on file  . Frequency of Social Gatherings with Friends and Family: Not on file  .  Attends Religious Services: Not on file  . Active Member of Clubs or Organizations: Not on file  . Attends Archivist Meetings: Not on file  . Marital Status: Not on file  Intimate Partner Violence:   . Fear of Current or Ex-Partner: Not on file  . Emotionally Abused: Not on file  . Physically Abused: Not on file  . Sexually Abused: Not on file      Review of systems: Review of Systems as per HPI All other systems reviewed and are negative.   Observations/Objective:   Data Reviewed:  Reviewed labs, radiology imaging, old records and pertinent past GI work up   Assessment and Plan/Recommendations:  24 yr F with GERD and IBS-constipation  GERD: Continue Dexilant 60 mg Anti reflux measures Use Carafate suspension 1gm before meals PRN  IBS-Constipation: Increase dietary fiber and fluid intake Start Miralax 1/2 capful daily Ok to use Linzess 177mcg as needed for severe constipation     I discussed the assessment and treatment plan with the patient. The patient was provided an opportunity to ask questions and all were answered. The patient agreed with the plan and demonstrated an understanding of the instructions.   The patient was advised to call back or seek an in-person evaluation if the symptoms worsen or if the  condition fails to improve as anticipated.  I provided 30 minutes of non-face-to-face time during this encounter.   Harl Bowie, MD   CC: Karleen Hampshire., MD

## 2019-09-24 ENCOUNTER — Encounter: Payer: Self-pay | Admitting: Gastroenterology

## 2019-09-27 ENCOUNTER — Telehealth: Payer: Self-pay | Admitting: *Deleted

## 2019-09-27 NOTE — Telephone Encounter (Signed)
Prior auth for carafate was denied because use of the drug does not meet requirement  Used GERD and Esophagitis as dx codes to get approved  Dr Silverio Decamp can she take the tablets?

## 2019-09-27 NOTE — Telephone Encounter (Signed)
Yes, she can use the tablets to make a suspension. Dissolve tablets in 2oz water and drink it as slurry. She already has the Carafate tablets, we don't have to send new rx. Thanks

## 2019-09-27 NOTE — Telephone Encounter (Signed)
Left message for patients recommendations on carafate

## 2020-03-29 ENCOUNTER — Telehealth: Payer: Self-pay | Admitting: Gastroenterology

## 2020-03-29 NOTE — Telephone Encounter (Signed)
Pt is requesting a call back from a nurse. Pt would not disclose further info.

## 2020-03-29 NOTE — Telephone Encounter (Signed)
Spoke with patient on a matter of needing to find an Neurosurgeon for a family member.

## 2020-03-30 NOTE — Telephone Encounter (Signed)
Patient called in reference to previous message

## 2020-09-28 ENCOUNTER — Other Ambulatory Visit: Payer: Self-pay | Admitting: Gastroenterology

## 2020-12-27 ENCOUNTER — Telehealth: Payer: Self-pay | Admitting: Gastroenterology

## 2020-12-27 NOTE — Telephone Encounter (Signed)
Patient  will forward me a list of medications that her insurance will cover.

## 2020-12-27 NOTE — Telephone Encounter (Signed)
Patient calling to inform her insurance carrier will no longer pay for Dexiliant beginning 01/25/2021. Pt wants to discuss med options.. Plz advise.. thanks

## 2020-12-28 MED ORDER — ESOMEPRAZOLE MAGNESIUM 40 MG PO CPDR: 40.0000 mg | DELAYED_RELEASE_CAPSULE | Freq: Every day | ORAL | 3 refills | Status: DC

## 2020-12-28 NOTE — Telephone Encounter (Signed)
Dr Silverio Decamp meds covered by pts insurance is esomeprazole delayed-rel   Omeprazole delayed-rel  Pantoprazole delayed rel tablet   What do you want to switch her to in place of the [place]

## 2020-12-28 NOTE — Telephone Encounter (Signed)
Esmoperazole delayed release 40mg  daily. Thanks

## 2020-12-28 NOTE — Telephone Encounter (Signed)
Sent in Nexium to patients pharmacy, patient informed

## 2021-02-15 ENCOUNTER — Encounter: Payer: Self-pay | Admitting: Gastroenterology

## 2021-02-15 ENCOUNTER — Ambulatory Visit: Payer: BC Managed Care – PPO | Admitting: Gastroenterology

## 2021-02-15 VITALS — BP 86/58 | HR 72 | Ht 68.0 in | Wt 185.5 lb

## 2021-02-15 DIAGNOSIS — R110 Nausea: Secondary | ICD-10-CM | POA: Diagnosis not present

## 2021-02-15 DIAGNOSIS — R630 Anorexia: Secondary | ICD-10-CM

## 2021-02-15 DIAGNOSIS — R1013 Epigastric pain: Secondary | ICD-10-CM | POA: Diagnosis not present

## 2021-02-15 DIAGNOSIS — K219 Gastro-esophageal reflux disease without esophagitis: Secondary | ICD-10-CM | POA: Diagnosis not present

## 2021-02-15 DIAGNOSIS — K5904 Chronic idiopathic constipation: Secondary | ICD-10-CM | POA: Diagnosis not present

## 2021-02-15 MED ORDER — FAMOTIDINE 40 MG PO TABS: 40.0000 mg | ORAL_TABLET | Freq: Every day | ORAL | 11 refills | Status: DC

## 2021-02-15 MED ORDER — LINACLOTIDE 145 MCG PO CAPS: 145.0000 ug | ORAL_CAPSULE | Freq: Every day | ORAL | 6 refills | Status: DC

## 2021-02-15 MED ORDER — ESOMEPRAZOLE MAGNESIUM 40 MG PO CPDR: 40.0000 mg | DELAYED_RELEASE_CAPSULE | Freq: Every day | ORAL | 3 refills | Status: DC

## 2021-02-15 NOTE — Patient Instructions (Addendum)
We will refill your Nexium,Pepcid and Linzess for you today  Dr Silverio Decamp added an abdominal ultrasound, radiology will be contacting you with this appointment   Dr Silverio Decamp recommends that you complete a bowel purge (to clean out your bowels). Please do the following: Purchase a bottle of Miralax over the counter as well as a box of 5 mg dulcolax tablets. Take 4 dulcolax tablets. Wait 1 hour. You will then drink 6-8 capfuls of Miralax mixed in an adequate amount of water/juice/gatorade (you may choose which of these liquids to drink) over the next 2-3 hours. You should expect results within 1 to 6 hours after completing the bowel purge.   Follow up in 1 year  Constipation, Adult Constipation is when a person has trouble pooping (having a bowel movement). When you have this condition, you may poop fewer than 3 times a week. Your poop (stool) may also be dry, hard, or bigger than normal. Follow these instructions at home: Eating and drinking  Eat foods that have a lot of fiber, such as: Fresh fruits and vegetables. Whole grains. Beans. Eat less of foods that are low in fiber and high in fat and sugar, such as: [place] fries. Hamburgers. Cookies. Candy. Soda. Drink enough fluid to keep your pee (urine) pale yellow.  General instructions Exercise regularly or as told by your doctor. Try to do 150 minutes of exercise each week. Go to the restroom when you feel like you need to poop. Do not hold it in. Take over-the-counter and prescription medicines only as told by your doctor. These include any fiber supplements. When you poop: Do deep breathing while relaxing your lower belly (abdomen). Relax your pelvic floor. The pelvic floor is a group of muscles that support the rectum, bladder, and intestines (as well as the uterus in women). Watch your condition for any changes. Tell your doctor if you notice any. Keep all follow-up visits as told by your doctor. This is important. Contact a  doctor if: You have pain that gets worse. You have a fever. You have not pooped for 4 days. You vomit. You are not hungry. You lose weight. You are bleeding from the opening of the butt (anus). You have thin, pencil-like poop. Get help right away if: You have a fever, and your symptoms suddenly get worse. You leak poop or have blood in your poop. Your belly feels hard or bigger than normal (bloated). You have very bad belly pain. You feel dizzy or you faint. Summary Constipation is when a person poops fewer than 3 times a week, has trouble pooping, or has poop that is dry, hard, or bigger than normal. Eat foods that have a lot of fiber. Drink enough fluid to keep your pee (urine) pale yellow. Take over-the-counter and prescription medicines only as told by your doctor. These include any fiber supplements. This information is not intended to replace advice given to you by your health care provider. Make sure you discuss any questions you have with your healthcare provider. Document Revised: 06/01/2019 Document Reviewed: 06/01/2019 Elsevier Patient Education  [place].   [employer]'s Current Therapy 2021 (pp. 213-216). [place], [place]: Elsevier.">  Gastroesophageal Reflux Disease, Adult Gastroesophageal reflux (GER) happens when acid from the stomach flows up into the tube that connects the mouth and the stomach (esophagus). Normally, food travels down the esophagus and stays in the stomach to be digested. However, when a person has GER, food and stomach acid sometimes move back up into the esophagus. If this  becomes a more serious problem, the person may be diagnosed with a disease called gastroesophageal reflux disease (GERD). GERD occurs when the reflux: Happens often. Causes frequent or severe symptoms. Causes problems such as damage to the esophagus. When stomach acid comes in contact with the esophagus, the acid may cause inflammation in the esophagus. Over time, GERD may  create small holes (ulcers) in the lining of the esophagus. What are the causes? This condition is caused by a problem with the muscle between the esophagus and the stomach (lower esophageal sphincter, or LES). Normally, the LES muscle closes after food passes through the esophagus to the stomach. When the LES is weakened or abnormal, it does not close properly, and that allows food and stomach acid to go back up into theesophagus. The LES can be weakened by certain dietary substances, medicines, and medical conditions, including: Tobacco use. Pregnancy. Having a hiatal hernia. Alcohol use. Certain foods and beverages, such as coffee, chocolate, onions, and peppermint. What increases the risk? You are more likely to develop this condition if you: Have an increased body weight. Have a connective tissue disorder. Take NSAIDs, such as ibuprofen. What are the signs or symptoms? Symptoms of this condition include: Heartburn. Difficult or painful swallowing and the feeling of having a lump in the throat. A bitter taste in the mouth. Bad breath and having a large amount of saliva. Having an upset or bloated stomach and belching. Chest pain. Different conditions can cause chest pain. Make sure you see your health care provider if you experience chest pain. Shortness of breath or wheezing. Ongoing (chronic) cough or a nighttime cough. Wearing away of tooth enamel. Weight loss. How is this diagnosed? This condition may be diagnosed based on a medical history and a physical exam. To determine if you have mild or severe GERD, your health care provider may also monitor how you respond to treatment. You may also have tests, including: A test to examine your stomach and esophagus with a small camera (endoscopy). A test that measures the acidity level in your esophagus. A test that measures how much pressure is on your esophagus. A barium swallow or modified barium swallow test to show the shape,  size, and functioning of your esophagus. How is this treated? Treatment for this condition may vary depending on how severe your symptoms are. Your health care provider may recommend: Changes to your diet. Medicine. Surgery. The goal of treatment is to help relieve your symptoms and to preventcomplications. Follow these instructions at home: Eating and drinking  Follow a diet as recommended by your health care provider. This may involve avoiding foods and drinks such as: Coffee and tea, with or without caffeine. Drinks that contain alcohol. Energy drinks and sports drinks. Carbonated drinks or sodas. Chocolate and cocoa. Peppermint and mint flavorings. Garlic and onions. Horseradish. Spicy and acidic foods, including peppers, chili powder, curry powder, vinegar, hot sauces, and barbecue sauce. Citrus fruit juices and citrus fruits, such as oranges, lemons, and limes. Tomato-based foods, such as red sauce, chili, salsa, and pizza with red sauce. Fried and fatty foods, such as donuts, french fries, potato chips, and high-fat dressings. High-fat meats, such as hot dogs and fatty cuts of red and white meats, such as rib eye steak, sausage, ham, and bacon. High-fat dairy items, such as whole milk, butter, and cream cheese. Eat small, frequent meals instead of large meals. Avoid drinking large amounts of liquid with your meals. Avoid eating meals during the 2-3 hours before bedtime.  Avoid lying down right after you eat. Do not exercise right after you eat.  Lifestyle  Do not use any products that contain nicotine or tobacco. These products include cigarettes, chewing tobacco, and vaping devices, such as e-cigarettes. If you need help quitting, ask your health care provider. Try to reduce your stress by using methods such as yoga or meditation. If you need help reducing stress, ask your health care provider. If you are overweight, reduce your weight to an amount that is healthy for you.  Ask your health care provider for guidance about a safe weight loss goal.  General instructions Pay attention to any changes in your symptoms. Take over-the-counter and prescription medicines only as told by your health care provider. Do not take aspirin, ibuprofen, or other NSAIDs unless your health care provider told you to take these medicines. Wear loose-fitting clothing. Do not wear anything tight around your waist that causes pressure on your abdomen. Raise (elevate) the head of your bed about 6 inches (15 cm). You can use a wedge to do this. Avoid bending over if this makes your symptoms worse. Keep all follow-up visits. This is important. Contact a health care provider if: You have: New symptoms. Unexplained weight loss. Difficulty swallowing or it hurts to swallow. Wheezing or a persistent cough. A hoarse voice. Your symptoms do not improve with treatment. Get help right away if: You have sudden pain in your arms, neck, jaw, teeth, or back. You suddenly feel sweaty, dizzy, or light-headed. You have chest pain or shortness of breath. You vomit and the vomit is green, yellow, or black, or it looks like blood or coffee grounds. You faint. You have stool that is red, bloody, or black. You cannot swallow, drink, or eat. These symptoms may represent a serious problem that is an emergency. Do not wait to see if the symptoms will go away. Get medical help right away. Call your local emergency services (911 in the [place] Do not drive yourself to the hospital. Summary Gastroesophageal reflux happens when acid from the stomach flows up into the esophagus. GERD is a disease in which the reflux happens often, causes frequent or severe symptoms, or causes problems such as damage to the esophagus. Treatment for this condition may vary depending on how severe your symptoms are. Your health care provider may recommend diet and lifestyle changes, medicine, or surgery. Contact a health care  provider if you have new or worsening symptoms. Take over-the-counter and prescription medicines only as told by your health care provider. Do not take aspirin, ibuprofen, or other NSAIDs unless your health care provider told you to do so. Keep all follow-up visits as told by your health care provider. This is important. This information is not intended to replace advice given to you by your health care provider. Make sure you discuss any questions you have with your healthcare provider. Document Revised: 01/23/2020 Document Reviewed: 01/23/2020 Elsevier Patient Education  2022 [employer].   Due to recent changes in healthcare laws, you may see the results of your imaging and laboratory studies on MyChart before your provider has had a chance to review them.  We understand that in some cases there may be results that are confusing or concerning to you. Not all laboratory results come back in the same time frame and the provider may be waiting for multiple results in order to interpret others.  Please give [place] 48 hours in order for your provider to thoroughly review all the results before contacting  the office for clarification of your results.    If you are age 59 or older, your body mass index should be between 23-30. Your Body mass index is 28.21 kg/m. If this is out of the aforementioned range listed, please consider follow up with your Primary Care Provider.  If you are age 23 or younger, your body mass index should be between 19-25. Your Body mass index is 28.21 kg/m. If this is out of the aformentioned range listed, please consider follow up with your Primary Care Provider.   __________________________________________________________  The  GI providers would like to encourage you to use [facility] to communicate with providers for non-urgent requests or questions.  Due to long hold times on the telephone, sending your provider a message by [facility] may be a faster and more efficient way  to get a response.  Please allow 48 business hours for a response.  Please remember that this is for non-urgent requests.    I appreciate the  opportunity to care for you  Thank You   Harl Bowie , MD

## 2021-02-15 NOTE — Progress Notes (Signed)
Jennifer HOLGERSON    UB:[id number]    [birth year]  Primary Care Physician:Furr, Cleone Slim., MD  Referring Physician: Karleen Hampshire., MD [id number] [place] [id number] [place],  [place] [address]   Chief complaint: GERD  HPI:  55 year old female here for follow-up visit for gerd and chronic constipation.  GERD symptoms are currently stable on Nexium daily, she has not had any significant breakthrough symptoms Complains of intermittent nausea and epigastric abdominal pain, sometimes worse with meals.  Using Linzess 186mg with regular bowel movements.   Denies any dysphagia, vomiting, melena or blood per rectum.  No decreased appetite or unintentional weight loss.   Colonoscopy July 18, 2016: Sigmoid colon hyperplastic polyp, internal hemorrhoids   EGD July 18, 2016:Normal   Outpatient Encounter Medications as of 02/15/2021  Medication Sig   ALPRAZolam (XANAX) 1 MG tablet Take 1 mg by mouth at bedtime.    AMBULATORY NON FORMULARY MEDICATION Allergy Shots Once a week   aspirin EC 81 MG tablet Take 81 mg daily by mouth.   baclofen (LIORESAL) 20 MG tablet Take 20 mg by mouth 2 (two) times daily.   botulinum toxin Type A (BOTOX) 100 units SOLR injection Botox 100 unit injection  Take by injection route.   cetirizine (ZYRTEC) 10 MG tablet Take 10 mg by mouth daily.   cyclobenzaprine (FLEXERIL) 10 MG tablet Take 10 mg by mouth 3 (three) times daily as needed for muscle spasms.   EPINEPHrine 0.3 mg/0.3 mL IJ SOAJ injection Inject 0.3 mg into the muscle once.   escitalopram (LEXAPRO) 10 MG tablet    esomeprazole (NEXIUM) 40 MG capsule Take 1 capsule (40 mg total) by mouth daily at 12 noon.   famotidine (PEPCID) 40 MG tablet Take 1 tablet (40 mg total) by mouth at bedtime. (Patient not taking: Reported on 09/23/2019)   Galcanezumab-gnlm (EMGALITY) 120 MG/ML SOAJ Inject 120 mg into the skin every 30 (thirty) days.   hydrOXYzine (ATARAX/VISTARIL) 25 MG tablet Take 25 mg daily by  mouth.   LINZESS 145 MCG CAPS capsule TAKE 1 CAPSULE BY MOUTH DAILY BEFORE BREAKFAST (Patient taking differently: as needed. )   Multiple Vitamin (MULTIVITAMIN WITH MINERALS) TABS tablet Take 1 tablet by mouth daily.   naproxen (NAPROSYN) 500 MG tablet Take 500 mg by mouth 2 (two) times daily as needed for moderate pain or headache.    nortriptyline (PAMELOR) 10 MG capsule TAKE 1 CAPSULE BY MOUTH EACH NIGHT AT BEDTIME (Patient not taking: Reported on 09/23/2019)   pramipexole (MIRAPEX) 0.25 MG tablet Take 0.75 mg by mouth 2 (two) times daily.    Rimegepant Sulfate (NURTEC) 75 MG TBDP Take by mouth as needed.   sucralfate (CARAFATE) 1 GM/10ML suspension Take 10 mLs (1 g total) by mouth 4 (four) times daily -  with meals and at bedtime.   No facility-administered encounter medications on file as of 02/15/2021.    Allergies as of 02/15/2021 - Review Complete 09/24/2019  Allergen Reaction Noted   Prednisone Other (See Comments) 09/01/2014   Codeine  12/16/2007   Gabapentin  08/30/2014    Past Medical History:  Diagnosis Date   Anemia    Anxiety    Arthritis    Chronic headaches    GERD (gastroesophageal reflux disease)    Migraines    Pulmonary embolism ([facility]     Past Surgical History:  Procedure Laterality Date   COLONOSCOPY  2009   normal   ESOPHAGEAL MANOMETRY  N/A 03/22/2018   Procedure: ESOPHAGEAL MANOMETRY (EM);  Surgeon: Mauri Pole, MD;  Location: [facility];  Service: Endoscopy;  Laterality: N/A;   ESOPHAGOGASTRODUODENOSCOPY  2009   esophagitis    excision of ingrown toenails Bilateral 07/11/2016   Excision of bilateral great toes   Mole on back     removed   Mole on leg Left    removed   NASAL SINUS SURGERY     [place] IMPEDANCE STUDY N/A 03/22/2018   Procedure: [place] IMPEDANCE STUDY;  Surgeon: Mauri Pole, MD;  Location: [facility]OSCOPY;  Service: Endoscopy;  Laterality: N/A;   RECONSTRUCTION OF NOSE     TONSILLECTOMY      Family History  Problem Relation  Age of Onset   Prostate cancer Maternal Uncle    Diabetes Maternal Grandmother    Heart disease Maternal Grandmother    Heart disease Paternal Grandfather    Hypertension Mother    Heart disease Father    Rectal cancer Maternal Aunt 72   Colon cancer Neg Hx    Stomach cancer Neg Hx    Esophageal cancer Neg Hx    Liver cancer Neg Hx     Social History   Socioeconomic History   Marital status: Divorced    Spouse name: Not on file   Number of children: 0   Years of education: Not on file   Highest education level: Not on file  Occupational History   Occupation: Pharmacist, hospital    Comment: high school   Tobacco Use   Smoking status: Never   Smokeless tobacco: Never  Vaping Use   Vaping Use: Never used  Substance and Sexual Activity   Alcohol use: Yes    Comment: wine    Drug use: No   Sexual activity: Yes    Partners: Male  Other Topics Concern   Not on file  Social History Narrative   Not on file   Social Determinants of Health   Financial Resource Strain: Not on file  Food Insecurity: Not on file  Transportation Needs: Not on file  Physical Activity: Not on file  Stress: Not on file  Social Connections: Not on file  Intimate Partner Violence: Not on file      Review of systems: All other review of systems negative except as mentioned in the HPI.   Physical Exam: Vitals:   02/15/21 0809  BP: (!) 86/58  Pulse: 72   Body mass index is 28.21 kg/m. Gen:      No acute distress HEENT:  sclera anicteric Abd:      soft, non-tender; no palpable masses, no distension Ext:    No edema Neuro: alert and oriented x 3 Psych: normal mood and affect  Data Reviewed:  Reviewed labs, radiology imaging, old records and pertinent past GI work up   Assessment and Plan/Recommendations:  55 year old very pleasant female with chronic GERD and IBS-constipation  Nausea and epigastric abdominal pain: We will need to exclude gallbladder disease Will obtain right upper  quadrant abdominal ultrasound   GERD: Continue Nexium daily  Pepcid at bedtime as needed Gust anti reflux measures Use Carafate suspension 1gm before meals PRN   IBS-Constipation: Increase dietary fiber and fluid intake Use Linzess 168mg daily Patient was provided instructions for bowel purge with MiraLAX as needed  Return in 1 year or sooner if needed  This visit required >30 minutes of patient care (this includes precharting, chart review, review of results, face-to-face time used for counseling as well as  treatment plan and follow-up. The patient was provided an opportunity to ask questions and all were answered. The patient agreed with the plan and demonstrated an understanding of the instructions.  Damaris Hippo , MD    CC: Karleen Hampshire., MD

## 2021-02-19 ENCOUNTER — Encounter: Payer: Self-pay | Admitting: Gastroenterology

## 2021-02-19 ENCOUNTER — Telehealth: Payer: Self-pay | Admitting: Gastroenterology

## 2021-02-19 NOTE — Telephone Encounter (Signed)
Patient called to get [place] scheduled

## 2021-02-20 NOTE — Telephone Encounter (Signed)
Radiology scheduling will contact her for the appointment. I gave the patient the scheduling phone number to call if she would like. [phone].

## 2021-03-13 ENCOUNTER — Ambulatory Visit (HOSPITAL_COMMUNITY)
Admission: RE | Admit: 2021-03-13 | Discharge: 2021-03-13 | Disposition: A | Discharge status: Home / Self Care | Payer: BC Managed Care – PPO | Source: Ambulatory Visit | Attending: Gastroenterology | Admitting: Gastroenterology

## 2021-03-13 ENCOUNTER — Other Ambulatory Visit: Payer: Self-pay

## 2021-03-13 DIAGNOSIS — R1013 Epigastric pain: Secondary | ICD-10-CM | POA: Diagnosis present

## 2021-03-13 DIAGNOSIS — K5904 Chronic idiopathic constipation: Secondary | ICD-10-CM | POA: Diagnosis present

## 2021-03-13 DIAGNOSIS — R630 Anorexia: Secondary | ICD-10-CM | POA: Insufficient documentation

## 2021-03-13 DIAGNOSIS — K219 Gastro-esophageal reflux disease without esophagitis: Secondary | ICD-10-CM | POA: Diagnosis present

## 2021-03-13 DIAGNOSIS — R110 Nausea: Secondary | ICD-10-CM | POA: Insufficient documentation

## 2021-08-07 ENCOUNTER — Other Ambulatory Visit: Payer: Self-pay | Admitting: Gastroenterology

## 2021-12-06 ENCOUNTER — Other Ambulatory Visit: Payer: Self-pay | Admitting: Gastroenterology

## 2022-01-30 ENCOUNTER — Telehealth: Payer: Self-pay | Admitting: Gastroenterology

## 2022-01-30 NOTE — Telephone Encounter (Signed)
Inbound call from patient stating that she believes that within the last month or so that Nexium seems not to be working. Patient is requesting a call back to discuss what she needs to do moving forwards. Please advise.

## 2022-01-31 ENCOUNTER — Other Ambulatory Visit: Payer: Self-pay

## 2022-01-31 MED ORDER — SUCRALFATE 1 GM/10ML PO SUSP
1.0000 g | Freq: Three times a day (TID) | ORAL | 1 refills | Status: DC
Start: 2022-01-31 — End: 2022-05-07

## 2022-01-31 NOTE — Telephone Encounter (Signed)
DOD Patient of Dr Woodward Ku last seen 01/2021. Her yearly check will be in September. She has a history of GERD and chronic constipation. She takes Linzess and Nexium daily for these problems. Constipation is under control. Patient has had increased emotional stress past 3 or more months. She began having breakthrough symptoms of reflux over a month ago. She began taking Pepcid at bedtime in addition to her Nexium. Despite this and diet modifications, she now has a constant burning sensation and increased reflux. She has been on multiple PPI's but cannot remember exactly why she has been changed from one PPI to another. She wants to wait for Dr Silverio Decamp on this.  She would like a refill of Sucralfate. Okay to refill?

## 2022-01-31 NOTE — Telephone Encounter (Signed)
Dr Silverio Decamp I have the patient coming to see you in September on the 13th. Do you have recommendations for her on the Nexium? She is taking Carafate suspension for now.

## 2022-02-03 ENCOUNTER — Other Ambulatory Visit: Payer: Self-pay

## 2022-02-03 MED ORDER — ESOMEPRAZOLE MAGNESIUM 40 MG PO CPDR: 40.0000 mg | DELAYED_RELEASE_CAPSULE | Freq: Two times a day (BID) | ORAL | 3 refills | Status: DC

## 2022-02-03 NOTE — Telephone Encounter (Signed)
Ok, agree with sooner appointment if available.  Please send prescription for Nexium 40 mg twice daily, 30 minutes before breakfast and dinner.  Okay to use Carafate suspension as needed up to 3 times daily for breakthrough symptoms.  We will discuss further management during office visit.  Thank you

## 2022-02-03 NOTE — Telephone Encounter (Signed)
Patient advised. New Nexium prescription sent to the [facility].

## 2022-04-01 IMAGING — US US ABDOMEN COMPLETE
1 series · 14 of 25 positions shown · non-contrast
Comparison: None.

CLINICAL DATA: Nausea with epigastric pain

EXAM:
ABDOMEN ULTRASOUND COMPLETE

[Series 1: us abdomen complete · 14 of 64 slices shown]
[im 1/64]
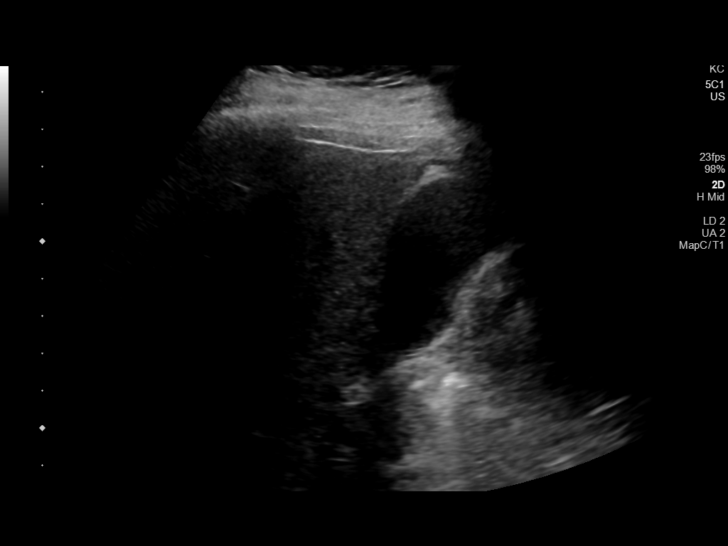
[im 6/64]
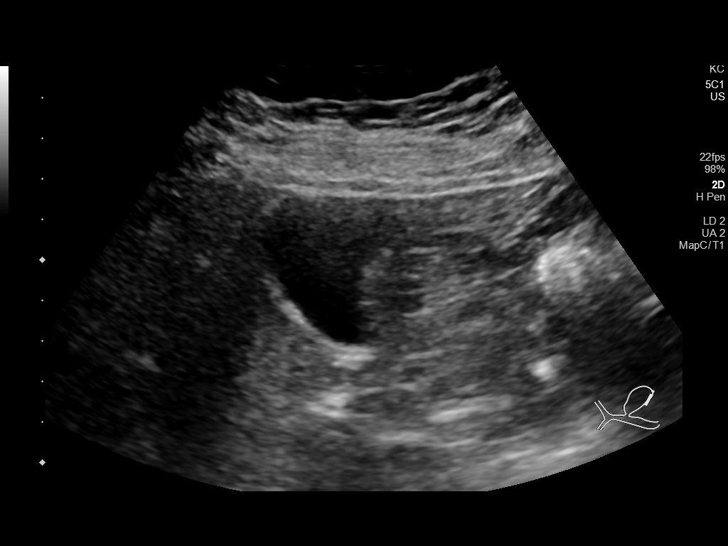
[im 11/64]
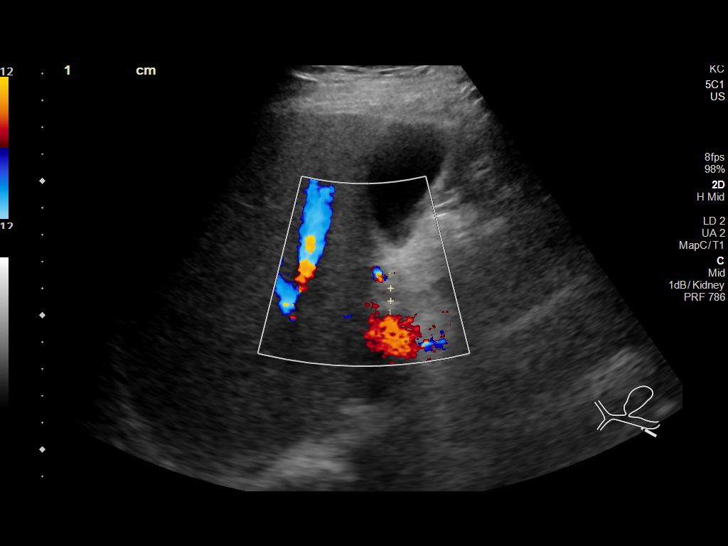
[im 16/64]
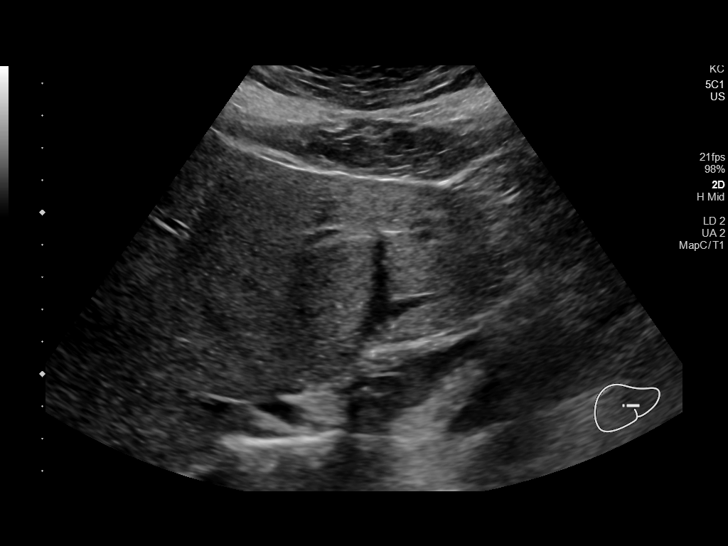
[im 22/64]
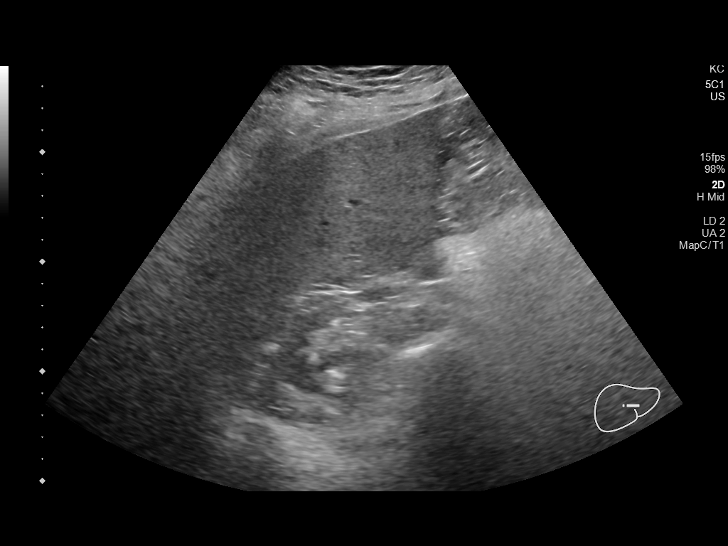
[im 24/64]
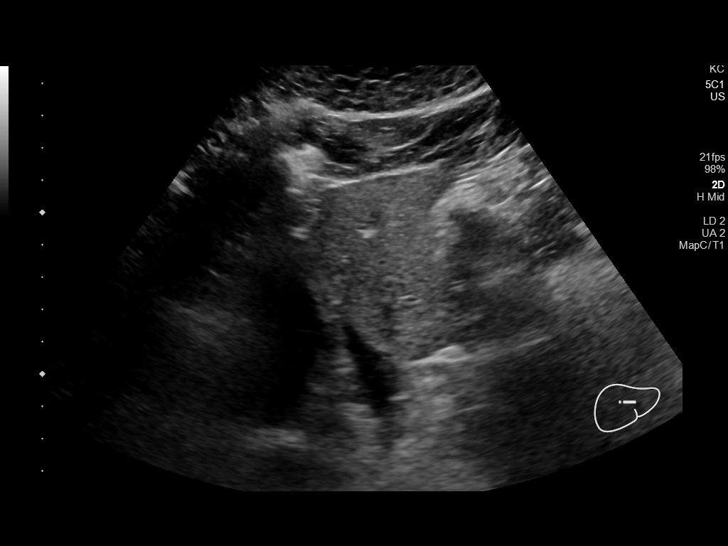
[im 29/64]
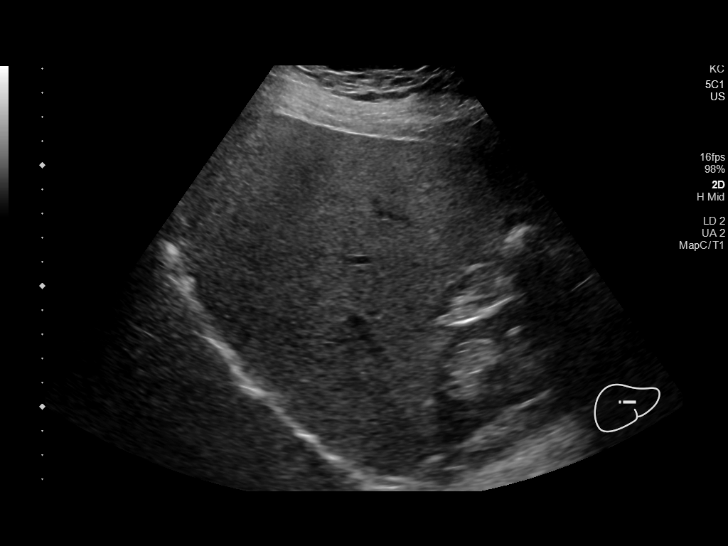
[im 35/64]
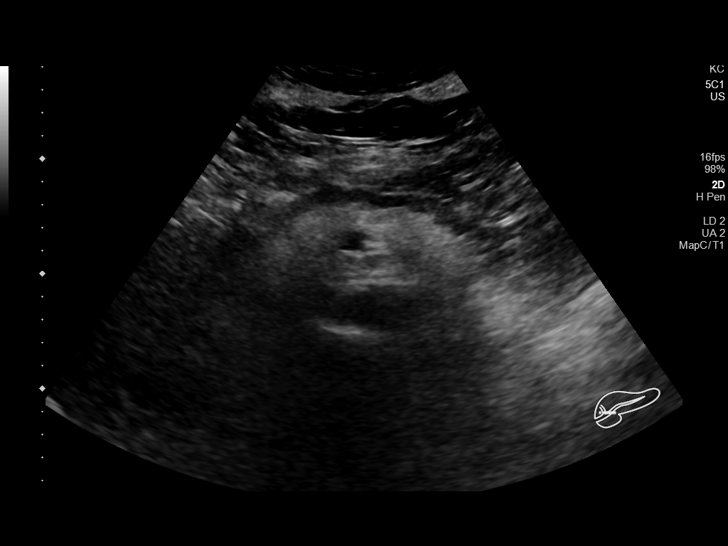
[im 40/64]
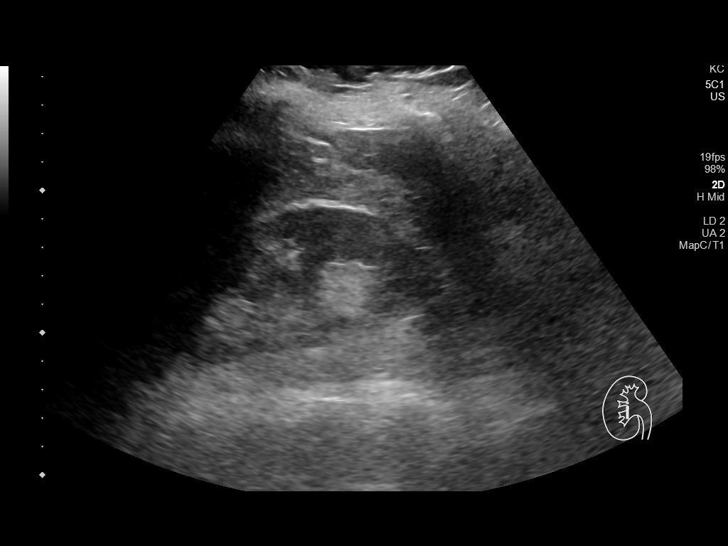
[im 43/64]
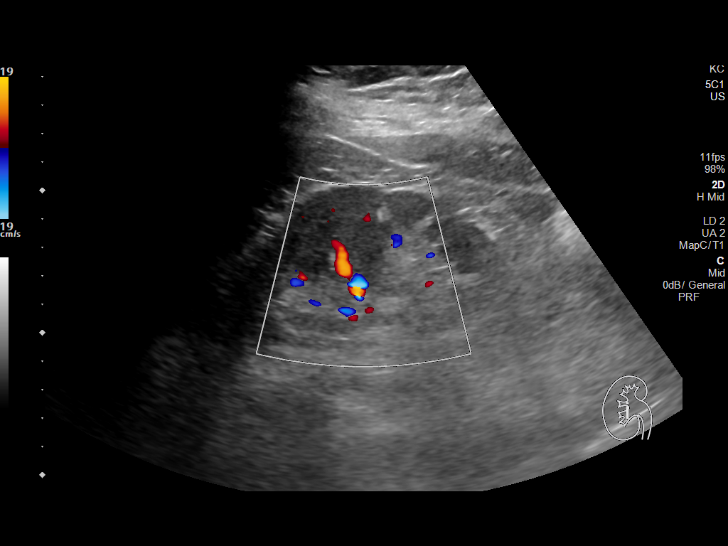
[im 48/64]
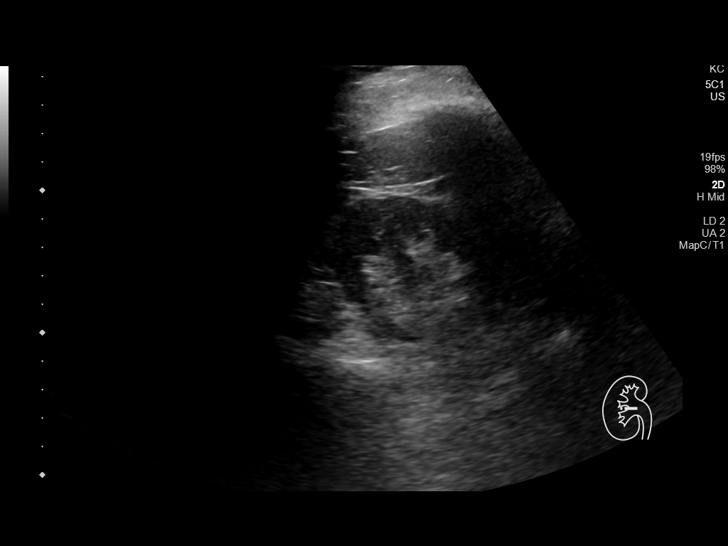
[im 53/64]
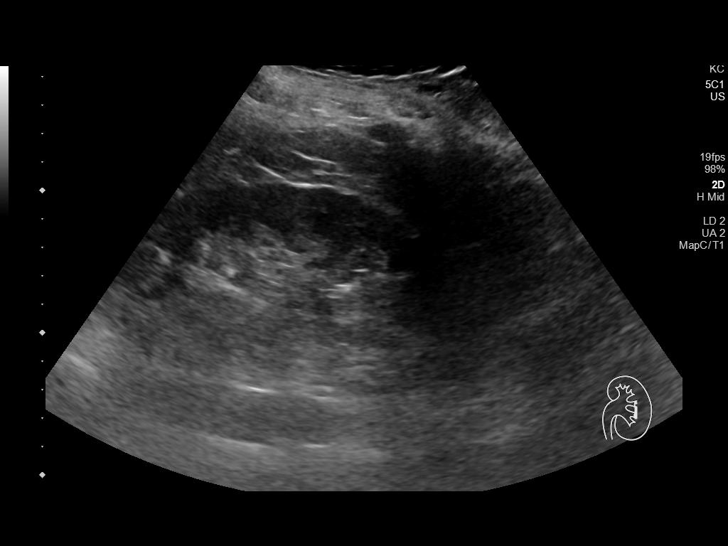
[im 58/64]
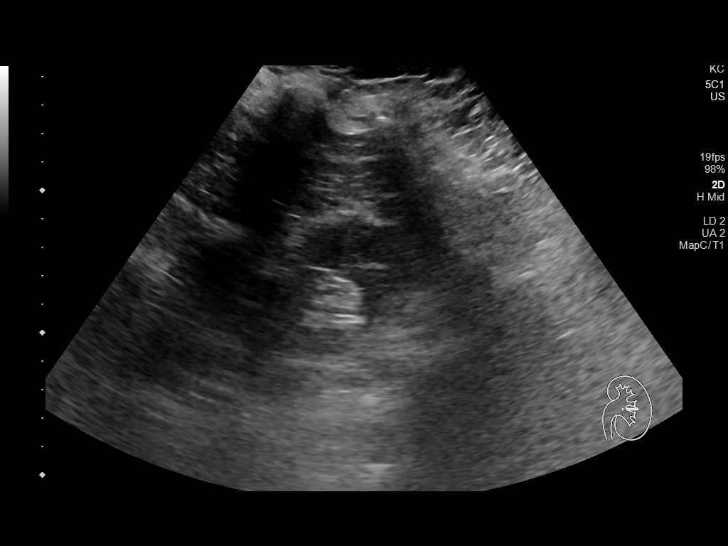
[im 64/64]
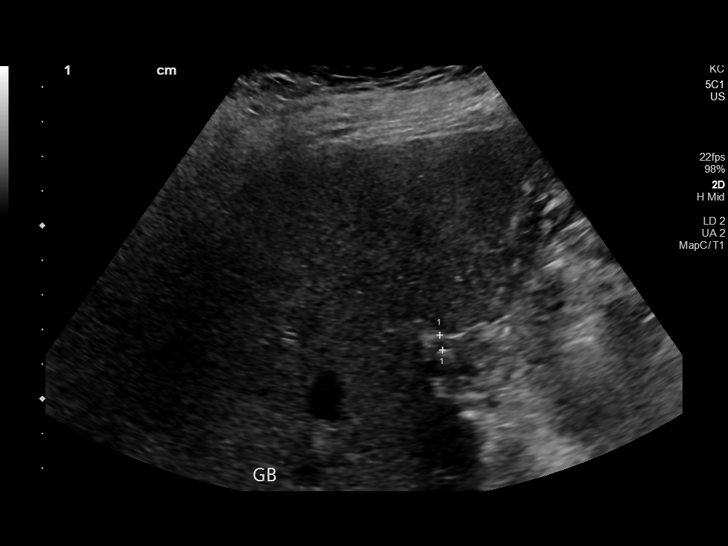

[14 of 25 positions shown; findings below may reference images not displayed]

FINDINGS: Gallbladder: No gallstones or wall thickening visualized. No
sonographic Murphy sign noted by sonographer.

Common bile duct: Diameter: 4 mm

Liver: Appears slightly echogenic. No focal hepatic abnormality.
Portal vein is patent on color Doppler imaging with normal direction
of blood flow towards the liver.

IVC: No abnormality visualized.

Pancreas: Visualized portion unremarkable.

Spleen: Size and appearance within normal limits.

Right Kidney: Length: 10.7 cm. Echogenicity within normal limits. No
mass or hydronephrosis visualized.

Left Kidney: Length: 10.8 cm. Echogenicity within normal limits. No
mass or hydronephrosis visualized.

Abdominal aorta: No aneurysm visualized.

Other findings: None.
IMPRESSION: 1. Slightly echogenic liver suggesting steatosis.
2. Otherwise negative abdominal ultrasound

## 2022-04-09 ENCOUNTER — Encounter: Payer: Self-pay | Admitting: Gastroenterology

## 2022-04-09 ENCOUNTER — Ambulatory Visit: Payer: BC Managed Care – PPO | Admitting: Gastroenterology

## 2022-05-07 ENCOUNTER — Ambulatory Visit: Payer: BC Managed Care – PPO | Admitting: Gastroenterology

## 2022-05-07 ENCOUNTER — Encounter: Payer: Self-pay | Admitting: Gastroenterology

## 2022-05-07 DIAGNOSIS — R110 Nausea: Secondary | ICD-10-CM | POA: Diagnosis not present

## 2022-05-07 DIAGNOSIS — K219 Gastro-esophageal reflux disease without esophagitis: Secondary | ICD-10-CM | POA: Diagnosis not present

## 2022-05-07 DIAGNOSIS — R6881 Early satiety: Secondary | ICD-10-CM | POA: Diagnosis not present

## 2022-05-07 MED ORDER — SUCRALFATE 1 G PO TABS: ORAL_TABLET | ORAL | 1 refills | Status: DC

## 2022-05-07 NOTE — Progress Notes (Signed)
05/07/2022 Jennifer Buckley [id number] 1966/02/13   HISTORY OF PRESENT ILLNESS:  This is a 56 year old female who is a patient of Dr. Woodward Ku.  She presents here today with complaints of uncontrolled reflux symptoms despite Nexium 40 mg twice daily (increased to BID in July).  Uses Carafate periodically, prefers the liquid as she thinks it is more effective although it is costly.  She had esophageal pH study and manometry that were normal in 2019.  Last EGD was in 2017.  She has been on several PPIs in the past including prevacid ad Dexilant.  Had tried nortriptyline previously and had side effects of nausea, vomiting, diarrhea with that.  Complains of feeling full very quickly even after small amounts of food.  Still having nausea even with just drinking water.  Previous abdominal ultrasound was normal.  Past Medical History:  Diagnosis Date   Anemia    Anxiety    Arthritis    Chronic headaches    GERD (gastroesophageal reflux disease)    Migraines    Pulmonary embolism ([place])    Past Surgical History:  Procedure Laterality Date   COLONOSCOPY  2009   normal   ESOPHAGEAL MANOMETRY N/A 03/22/2018   Procedure: ESOPHAGEAL MANOMETRY (EM);  Surgeon: Mauri Pole, MD;  [facility];  Service: Endoscopy;  Laterality: N/A;   ESOPHAGOGASTRODUODENOSCOPY  2009   esophagitis    excision of ingrown toenails Bilateral 07/11/2016   Excision of bilateral great toes   Mole on back     removed   Mole on leg Left    removed   NASAL SINUS SURGERY     [place] IMPEDANCE STUDY N/A 03/22/2018   Procedure: [place] IMPEDANCE STUDY;  Surgeon: Mauri Pole, MD;  Location: [facility];  Service: Endoscopy;  Laterality: N/A;   RECONSTRUCTION OF NOSE     TONSILLECTOMY      reports that she has never smoked. She has never used smokeless tobacco. She reports current alcohol use. She reports that she does not use drugs. family history includes Arthritis in her father, maternal grandmother,  mother, and paternal grandmother; Diabetes in her maternal grandmother; Heart disease in her father, maternal grandmother, and paternal grandfather; Hypertension in her mother; Prostate cancer in her maternal uncle; Rectal cancer (age of onset: 63) in her maternal aunt; Skin cancer in her father; Thyroid disease in her mother. Allergies  Allergen Reactions   Hydroxyzine Hcl Diarrhea and Nausea And Vomiting   Nortriptyline Hcl Diarrhea and Nausea And Vomiting   Prednisone Other (See Comments)    "trouble taking" Can take Methylprednisolone   Codeine     REACTION: GI UPSET   Gabapentin     Disoriented      Outpatient Encounter Medications as of 05/07/2022  Medication Sig   ALPRAZolam (XANAX) 1 MG tablet Take 1 mg by mouth at bedtime.    aspirin EC 81 MG tablet Take 81 mg daily by mouth.   benzonatate (TESSALON) 200 MG capsule Take 200 capsules by mouth as needed.   botulinum toxin Type A (BOTOX) 100 units SOLR injection Botox 100 unit injection  Take by injection route.   budesonide (RHINOCORT AQUA) 32 MCG/ACT nasal spray Place 2 sprays into both nostrils daily.   buPROPion (WELLBUTRIN XL) 300 MG 24 hr tablet Take 300 mg by mouth daily.   cetirizine (ZYRTEC) 10 MG tablet Take 10 mg by mouth daily.   escitalopram (LEXAPRO) 10 MG tablet    esomeprazole (NEXIUM) 40 MG capsule Take  1 capsule (40 mg total) by mouth 2 (two) times daily before a meal.   famotidine (PEPCID) 40 MG tablet Take 1 tablet (40 mg total) by mouth at bedtime.   linaclotide (LINZESS) 145 MCG CAPS capsule Take 1 capsule (145 mcg total) by mouth daily before breakfast.   promethazine (PHENERGAN) 25 MG tablet Take 25 mg by mouth every 6 (six) hours as needed.   Rimegepant Sulfate 75 MG TBDP Take by mouth as needed.   sucralfate (CARAFATE) 1 GM/10ML suspension Take 10 mLs (1 g total) by mouth 4 (four) times daily -  with meals and at bedtime.   [DISCONTINUED] AMBULATORY NON FORMULARY MEDICATION CBD oil 1 dose under the  tongue every night   [DISCONTINUED] baclofen (LIORESAL) 20 MG tablet Take 20 mg by mouth 2 (two) times daily.   [DISCONTINUED] montelukast (SINGULAIR) 10 MG tablet Take 10 mg by mouth every morning.   [DISCONTINUED] naproxen (NAPROSYN) 500 MG tablet Take 500 mg by mouth 2 (two) times daily as needed for moderate pain or headache.    No facility-administered encounter medications on file as of 05/07/2022.     REVIEW OF SYSTEMS  : All other systems reviewed and negative except where noted in the History of Present Illness.   PHYSICAL EXAM: BP (!) 80/70 (BP Location: Left Arm, Patient Position: Sitting, Cuff Size: Normal)   Pulse 85   Ht '5\' 8"'$  (1.727 m)   Wt 182 lb (82.6 kg)   SpO2 98%   BMI 27.67 kg/m  General: Well developed white female in no acute distress Head: Normocephalic and atraumatic Eyes:  Sclerae anicteric, conjunctiva pink. Ears: Normal auditory acuity Lungs: Clear throughout to auscultation; no W/R/R. Heart: Regular rate and rhythm; no M/R/G. Abdomen: Soft, non-distended.  BS present.  Mild epigastric TTP. Musculoskeletal: Symmetrical with no gross deformities  Skin: No lesions on visible extremities Extremities: No edema  Neurological: Alert oriented x 4, grossly non-focal Psychological:  Alert and cooperative. Normal mood and affect  ASSESSMENT AND PLAN: *56 year old female with complaints of uncontrolled reflux symptoms despite Nexium 40 mg twice daily.  Uses Carafate periodically, prefers the liquid as she thinks it is more effective although it is costly.  She is willing to try Carafate tablet again and make that into her suspension.  We will plan to do that 4 times daily, before meals and at bedtime.  Prescription sent to pharmacy.  She had esophageal pH study and manometry that were normal in 2019.  Last EGD was in 2017.  She has been on several PPIs in the past.  Had tried nortriptyline previously and had side effects of nausea, vomiting, diarrhea with that.   Complains of feeling full very quickly even after small amounts of food.  We will check gastric emptying scan.  May need to consider repeat EGD as well.   CC:  Karleen Hampshire., MD

## 2022-05-07 NOTE — Patient Instructions (Signed)
If you are age 56 or younger, your body mass index should be between 19-25. Your Body mass index is 27.67 kg/m. If this is out of the aformentioned range listed, please consider follow up with your Primary Care Provider.  ________________________________________________________  The [facility] providers would like to encourage you to use [facility] to communicate with providers for non-urgent requests or questions.  Due to long hold times on the telephone, sending your provider a message by [place] may be a faster and more efficient way to get a response.  Please allow 48 business hours for a response.  Please remember that this is for non-urgent requests.  _______________________________________________________  Jennifer Buckley have been scheduled for a gastric emptying scan at [facility] on 05-27-22 at 7:30am. Please arrive at least 15 minutes prior to your appointment for registration. Please make certain not to have anything to eat or drink after midnight the night before your test. Hold all stomach medications (ex: Zofran, phenergan, Reglan) 48 hours prior to your test. If you need to reschedule your appointment, please contact radiology scheduling at [phone]. _____________________________________________________________________ A gastric-emptying study measures how long it takes for food to move through your stomach. There are several ways to measure stomach emptying. In the most common test, you eat food that contains a small amount of radioactive material. A scanner that detects the movement of the radioactive material is placed over your abdomen to monitor the rate at which food leaves your stomach. This test normally takes about 4 hours to complete. _____________________________________________________________________  Due to recent changes in healthcare laws, you may see the results of your imaging and laboratory studies on MyChart before your provider has had a chance to review them.  We  understand that in some cases there may be results that are confusing or concerning to you. Not all laboratory results come back in the same time frame and the provider may be waiting for multiple results in order to interpret others.  Please give [place] 48 hours in order for your provider to thoroughly review all the results before contacting the office for clarification of your results.   Thank you for entrusting me with your care and choosing [facility].  Alonza Bogus, PA-C

## 2022-05-08 ENCOUNTER — Other Ambulatory Visit: Payer: Self-pay | Admitting: Gastroenterology

## 2022-05-26 ENCOUNTER — Telehealth: Payer: Self-pay | Admitting: Gastroenterology

## 2022-05-26 NOTE — Telephone Encounter (Signed)
This was scheduled while in the office will need to go to the [employer].  Looks like Jennifer Buckley was with her.

## 2022-05-26 NOTE — Telephone Encounter (Signed)
Patient called states her insurance has test tomorrow at [facility] instead of [facility]. States she was asked for a facility change before proceeding. Please call to advise. Patient is requesting a call back or a message in MyChart when appt has been taking care of.

## 2022-05-27 ENCOUNTER — Encounter (HOSPITAL_COMMUNITY)
Admission: RE | Admit: 2022-05-27 | Discharge: 2022-05-27 | Disposition: A | Discharge status: Home / Self Care | Payer: BC Managed Care – PPO | Source: Ambulatory Visit | Attending: Gastroenterology | Admitting: Gastroenterology

## 2022-05-27 DIAGNOSIS — R6881 Early satiety: Secondary | ICD-10-CM | POA: Diagnosis present

## 2022-05-27 DIAGNOSIS — R110 Nausea: Secondary | ICD-10-CM | POA: Insufficient documentation

## 2022-05-27 DIAGNOSIS — K219 Gastro-esophageal reflux disease without esophagitis: Secondary | ICD-10-CM | POA: Insufficient documentation

## 2022-05-27 MED ORDER — TECHNETIUM TC 99M SULFUR COLLOID
2.1700 | Freq: Once | INTRAVENOUS | Status: AC | PRN
  Administered 2022-05-27: 2.17 via ORAL

## 2022-05-27 NOTE — Telephone Encounter (Signed)
Can you help me with this? Patient had gastric emptying scan this am at 730am.  Thank you

## 2022-06-24 ENCOUNTER — Encounter: Payer: Self-pay | Admitting: Gastroenterology

## 2022-06-24 ENCOUNTER — Ambulatory Visit: Payer: BC Managed Care – PPO | Admitting: Gastroenterology

## 2022-06-24 VITALS — BP 94/62 | HR 80 | Ht 67.75 in | Wt 182.2 lb

## 2022-06-24 DIAGNOSIS — K219 Gastro-esophageal reflux disease without esophagitis: Secondary | ICD-10-CM | POA: Diagnosis not present

## 2022-06-24 DIAGNOSIS — K3184 Gastroparesis: Secondary | ICD-10-CM

## 2022-06-24 NOTE — Patient Instructions (Signed)
Follow Gastroparesis diet.   Take Nexium with breakfast and dinner.   Take Pepcid midday and bedtime , if needed.   A high fiber diet with plenty of fluids (up to 8 glasses of water daily) is suggested to relieve these symptoms.  Benefiber 2 tablespoon once  daily.    Send my chart message in 6-8  weeks, with update on symptoms.    Due to recent changes in healthcare laws, you may see the results of your imaging and laboratory studies on MyChart before your provider has had a chance to review them.  We understand that in some cases there may be results that are confusing or concerning to you. Not all laboratory results come back in the same time frame and the provider may be waiting for multiple results in order to interpret others.  Please give [place] 48 hours in order for your provider to thoroughly review all the results before contacting the office for clarification of your results.   Thank you for choosing me and [facility].  Alonza Bogus PA

## 2022-06-24 NOTE — Progress Notes (Signed)
06/24/2022 Jennifer Buckley [id number] Feb 03, 1966   HISTORY OF PRESENT ILLNESS:  This is a 56 year old female who is a patient of Dr. Woodward Ku.  She follows here for long-standing issues with acid reflux.  Is on Nexium 40 mg BID but takes it at bedtime for her evening dose.  Was complaining of feeling full very quickly after eating so we ordered at [place] with results as follows:  IMPRESSION: Normal gastric emptying over the first 3 hours with slightly delayed gastric emptying in the fourth hour may reflect mild gastroparesis.  Says that her appetite still is not good.  Wants to eat only bland items.  Still having reflux.  Uses carafate periodically but does not think that the homemade slurry made from the pill works the same as the liquid.  She says that since the GES she feels like her BMs have not found a "happy medium" and that things feel irritated.  Has not been needing to take her Linzess.  She had esophageal pH study and manometry that were normal in 2019.  Last EGD was in 2017.  She has been on several PPIs in the past including prevacid ad Dexilant.  Had tried nortriptyline previously and had side effects of nausea, vomiting, diarrhea with that.   Past Medical History:  Diagnosis Date   Anemia    Anxiety    Arthritis    Chronic headaches    GERD (gastroesophageal reflux disease)    Migraines    Pulmonary embolism ([place])    Past Surgical History:  Procedure Laterality Date   COLONOSCOPY  2009   normal   ESOPHAGEAL MANOMETRY N/A 03/22/2018   Procedure: ESOPHAGEAL MANOMETRY (EM);  Surgeon: Mauri Pole, MD;  Location: [facility];  Service: Endoscopy;  Laterality: N/A;   ESOPHAGOGASTRODUODENOSCOPY  2009   esophagitis    excision of ingrown toenails Bilateral 07/11/2016   Excision of bilateral great toes   Mole on back     removed   Mole on leg Left    removed   NASAL SINUS SURGERY     [place] IMPEDANCE STUDY N/A 03/22/2018   Procedure: [place] IMPEDANCE STUDY;   Surgeon: Mauri Pole, MD;  Location: [facility];  Service: Endoscopy;  Laterality: N/A;   RECONSTRUCTION OF NOSE     TONSILLECTOMY      reports that she has never smoked. She has never used smokeless tobacco. She reports that she does not currently use alcohol. She reports that she does not use drugs. family history includes Arthritis in her father, maternal grandmother, mother, and paternal grandmother; Diabetes in her maternal grandmother; Heart disease in her father, maternal grandmother, and paternal grandfather; Hypertension in her mother; Prostate cancer in her maternal uncle; Rectal cancer (age of onset: 48) in her maternal aunt; Skin cancer in her father; Thyroid disease in her mother. Allergies  Allergen Reactions   Hydroxyzine Hcl Diarrhea and Nausea And Vomiting   Nortriptyline Hcl Diarrhea and Nausea And Vomiting   Prednisone Other (See Comments)    "trouble taking" Can take Methylprednisolone   Codeine     REACTION: GI UPSET   Gabapentin     Disoriented      Outpatient Encounter Medications as of 06/24/2022  Medication Sig   ALPRAZolam (XANAX) 1 MG tablet Take 1 mg by mouth at bedtime.    aspirin EC 81 MG tablet Take 81 mg daily by mouth.   benzonatate (TESSALON) 200 MG capsule Take 200 capsules by mouth as needed.  botulinum toxin Type A (BOTOX) 100 units SOLR injection Botox 100 unit injection  Take by injection route.   budesonide (RHINOCORT AQUA) 32 MCG/ACT nasal spray Place 2 sprays into both nostrils daily.   buPROPion (WELLBUTRIN XL) 300 MG 24 hr tablet Take 300 mg by mouth daily.   cetirizine (ZYRTEC) 10 MG tablet Take 10 mg by mouth daily.   escitalopram (LEXAPRO) 10 MG tablet    esomeprazole (NEXIUM) 40 MG capsule TAKE 1 CAPSULE BY MOUTH 2 TIMES DAILY BEFORE A MEAL   famotidine (PEPCID) 40 MG tablet Take 1 tablet (40 mg total) by mouth at bedtime.   linaclotide (LINZESS) 145 MCG CAPS capsule Take 1 capsule (145 mcg total) by mouth daily before  breakfast.   promethazine (PHENERGAN) 25 MG tablet Take 25 mg by mouth every 6 (six) hours as needed.   Rimegepant Sulfate 75 MG TBDP Take by mouth as needed.   sucralfate (CARAFATE) 1 g tablet Crush 1 tablet and dissolve in 10 ml of warm water, mix well to create slurry and drink before meals and at night   No facility-administered encounter medications on file as of 06/24/2022.     REVIEW OF SYSTEMS  : All other systems reviewed and negative except where noted in the History of Present Illness.   PHYSICAL EXAM: BP 94/62   Pulse 80   Ht 5' 7.75" (1.721 m)   Wt 182 lb 4 oz (82.7 kg)   BMI 27.92 kg/m  General: Well developed white female in no acute distress Head: Normocephalic and atraumatic Eyes:  Sclerae anicteric, conjunctiva pink. Ears: Normal auditory acuity Lungs: Clear throughout to auscultation; no W/R/R. Heart: Regular rate and rhythm; no M/R/G. Abdomen: Soft, non-distended.  BS present.  Mild diffuse TTP. Musculoskeletal: Symmetrical with no gross deformities  Skin: No lesions on visible extremities Extremities: No edema  Neurological: Alert oriented x 4, grossly non-focal Psychological:  Alert and cooperative. Normal mood and affect  ASSESSMENT AND PLAN: *GERD and likely mild gastroparesis by GES:  She wants to hold off on trying Reglan for now.  She wants to try gastroparesis diet for now and see how she does.  Literature was given.  Should be taking Nexium 40 mg BID before breakfast and dinner.  Use Pepcid mid-day and at bedtime.  She will send up an update on her symptoms in 6-8 weeks.  If we try Reglan then would likely just start 5 mg before breakfast and at bedtime. *Inconsistent bowel habits:  Has been experiencing this since the GES, doubt it had effect on this especially this far out from it.  Will try adding Benefiber 2 tsp mixed in 8 ounces of liquid daily.  **Of note:  She had esophageal pH study and manometry that were normal in 2019.  Last EGD was in 2017.   She has been on several PPIs in the past.  Had tried nortriptyline previously and had side effects of nausea, vomiting, diarrhea with that.     CC:  Karleen Hampshire., MD

## 2022-07-04 ENCOUNTER — Telehealth: Payer: Self-pay

## 2022-07-04 ENCOUNTER — Encounter: Payer: Self-pay | Admitting: Gastroenterology

## 2022-07-04 NOTE — Telephone Encounter (Signed)
-----   Message from Loralie Champagne, PA-C sent at 07/04/2022  4:13 PM EST ----- Can you please inform her that the Benefiber should be started at 2 tsp in 8 ounces of liquid daily?  When I saw her last my CMA put 2 Tbsp in her AVS.

## 2022-07-04 NOTE — Telephone Encounter (Signed)
I have called and spoken with the pt and advised her of the error on the AVS and given her the correct amount.  The pt has been advised of the information and verbalized understanding.

## 2022-08-07 ENCOUNTER — Other Ambulatory Visit: Payer: Self-pay | Admitting: Gastroenterology

## 2022-09-22 ENCOUNTER — Other Ambulatory Visit: Payer: Self-pay

## 2022-09-22 MED ORDER — ESOMEPRAZOLE MAGNESIUM 40 MG PO CPDR: DELAYED_RELEASE_CAPSULE | ORAL | 5 refills | Status: DC

## 2022-09-22 NOTE — Telephone Encounter (Signed)
Generic Nexium refilled as pharmacy requested, she is up to date on her visits.

## 2023-01-08 ENCOUNTER — Telehealth: Payer: Self-pay | Admitting: Internal Medicine

## 2023-05-26 ENCOUNTER — Other Ambulatory Visit: Payer: Self-pay | Admitting: Gastroenterology

## 2023-11-02 ENCOUNTER — Other Ambulatory Visit: Payer: Self-pay | Admitting: Gastroenterology

## 2023-12-03 ENCOUNTER — Other Ambulatory Visit: Payer: Self-pay | Admitting: Gastroenterology

## 2023-12-03 ENCOUNTER — Telehealth: Payer: Self-pay | Admitting: Gastroenterology

## 2023-12-03 MED ORDER — ESOMEPRAZOLE MAGNESIUM 40 MG PO CPDR: DELAYED_RELEASE_CAPSULE | ORAL | 5 refills | Status: DC

## 2023-12-03 NOTE — Telephone Encounter (Signed)
 Sent in refills, Spoke with patient and told her she needs an office appointment. She is scheduled with Jessica Zehr (Patient preference) 7/22

## 2023-12-03 NOTE — Telephone Encounter (Signed)
 Requesting medication refill for omeprazole  be sent to [facility] on [address] [place]. Please advise.   Thank you

## 2024-02-16 ENCOUNTER — Encounter: Payer: Self-pay | Admitting: Gastroenterology

## 2024-02-16 ENCOUNTER — Ambulatory Visit (INDEPENDENT_AMBULATORY_CARE_PROVIDER_SITE_OTHER): Payer: Self-pay | Admitting: Gastroenterology

## 2024-02-16 VITALS — BP 116/64 | HR 74 | Ht 68.0 in | Wt 188.2 lb

## 2024-02-16 DIAGNOSIS — K219 Gastro-esophageal reflux disease without esophagitis: Secondary | ICD-10-CM | POA: Diagnosis not present

## 2024-02-16 DIAGNOSIS — K5909 Other constipation: Secondary | ICD-10-CM

## 2024-02-16 MED ORDER — LINACLOTIDE 72 MCG PO CAPS
72.0000 ug | ORAL_CAPSULE | Freq: Every day | ORAL | Status: AC
Start: 2024-02-16 — End: ?

## 2024-02-16 MED ORDER — ESOMEPRAZOLE MAGNESIUM 40 MG PO CPDR: DELAYED_RELEASE_CAPSULE | ORAL | 5 refills | Status: AC

## 2024-02-16 MED ORDER — SUCRALFATE 1 GM/10ML PO SUSP: 1.0000 g | Freq: Every day | ORAL | 3 refills | Status: AC | PRN

## 2024-02-16 NOTE — Patient Instructions (Signed)
 We have sent the following medications to your pharmacy for you to pick up at your convenience: Nexium , Carafate   We have given you samples of the following medication to take: Linzess  72 mcg daily before breakfast.   Call or send Mychart message with an update in 7-10 days.

## 2024-02-16 NOTE — Progress Notes (Signed)
 02/23/2024 CLEON THOMA [id number] Mar 11, 1966   HISTORY OF PRESENT ILLNESS:  This is a 58 year old female who is a patient of Dr. Trenna.  She follows here for long-standing issues with acid reflux.  Is on Nexium  40 mg BID.  Also takes famotidine  40 mg at bedtime.  Keeps Carafate  suspension on hand for as needed use. Was complaining of feeling full very quickly after eating so we ordered a GES with results as follows:   IMPRESSION: Normal gastric emptying over the first 3 hours with slightly delayed gastric emptying in the fourth hour may reflect mild gastroparesis.   She elected not to try Reglan.    Last visit here was November 2023.  She is here today for Nexium  refills.  Overall she says that she feels like she follows a strict diet and with her medications her symptoms seem to be pretty well-controlled.   She had esophageal pH study and manometry that were normal in 2019.  Last EGD was in 2017.  She has been on several PPIs in the past including prevacid  ad Dexilant .  Had tried nortriptyline  previously and had side effects of nausea, vomiting, diarrhea with that.    She has Linzess  45 mcg to take for her constipation, but seems like that might be too much.  She did not tolerate Amitiza  previously.  Past Medical History:  Diagnosis Date   Anemia    Anxiety    Arthritis    Chronic headaches    GERD (gastroesophageal reflux disease)    Migraines    Pulmonary embolism ([facility])    Past Surgical History:  Procedure Laterality Date   COLONOSCOPY  2009   normal   ESOPHAGEAL MANOMETRY N/A 03/22/2018   Procedure: ESOPHAGEAL MANOMETRY (EM);  Surgeon: Shila Gustav GAILS, MD;  Location: [facility];  Service: Endoscopy;  Laterality: N/A;   ESOPHAGOGASTRODUODENOSCOPY  2009   esophagitis    excision of ingrown toenails Bilateral 07/11/2016   Excision of bilateral great toes   Mole on back     removed   Mole on leg Left    removed   NASAL SINUS SURGERY     [place] IMPEDANCE STUDY  N/A 03/22/2018   Procedure: [place] IMPEDANCE STUDY;  Surgeon: Shila Gustav GAILS, MD;  Location: [facility];  Service: Endoscopy;  Laterality: N/A;   RECONSTRUCTION OF NOSE     TONSILLECTOMY      reports that she has never smoked. She has never used smokeless tobacco. She reports that she does not currently use alcohol. She reports that she does not use drugs. family history includes Arthritis in her father, maternal grandmother, mother, and paternal grandmother; Diabetes in her maternal grandmother; Heart disease in her father, maternal grandmother, and paternal grandfather; Hypertension in her mother; Prostate cancer in her maternal uncle; Rectal cancer (age of onset: 64) in her maternal aunt; Skin cancer in her father; Thyroid disease in her mother. Allergies  Allergen Reactions   Hydroxyzine Hcl Diarrhea and Nausea And Vomiting   Nortriptyline  Hcl Diarrhea and Nausea And Vomiting   Prednisone Other (See Comments)    trouble taking Can take Methylprednisolone   Codeine     REACTION: GI UPSET   Gabapentin     Disoriented      Outpatient Encounter Medications as of 02/16/2024  Medication Sig   ALPRAZolam (XANAX) 1 MG tablet Take 1 mg by mouth at bedtime.    aspirin EC 81 MG tablet Take 81 mg daily by mouth.  benzonatate (TESSALON) 200 MG capsule Take 200 capsules by mouth as needed.   botulinum toxin Type A (BOTOX) 100 units SOLR injection Botox 100 unit injection  Take by injection route.   budesonide (RHINOCORT AQUA) 32 MCG/ACT nasal spray Place 2 sprays into both nostrils daily.   cetirizine (ZYRTEC) 10 MG tablet Take 10 mg by mouth daily.   escitalopram (LEXAPRO) 10 MG tablet    famotidine  (PEPCID ) 40 MG tablet TAKE 1 TABLET BY MOUTH EVERY NIGHT AT BEDTIME   linaclotide  (LINZESS ) 72 MCG capsule Take 1 capsule (72 mcg total) by mouth daily before breakfast.   PERIDEX 0.12 % solution Use as directed 5 mLs in the mouth or throat 2 (two) times daily.   promethazine (PHENERGAN) 25 MG  tablet Take 25 mg by mouth every 6 (six) hours as needed.   QULIPTA 60 MG TABS Take 1 tablet by mouth daily.   Rimegepant Sulfate 75 MG TBDP Take by mouth as needed.   sucralfate  (CARAFATE ) 1 GM/10ML suspension Take 10 mLs (1 g total) by mouth daily as needed.   tiZANidine (ZANAFLEX) 4 MG tablet Take 4 mg by mouth at bedtime.   ZAVZPRET 10 MG/ACT SOLN Place 10 mg into the nose as needed.   [DISCONTINUED] esomeprazole  (NEXIUM ) 40 MG capsule TAKE 1 CAPSULE BY MOUTH TWICE A DAY BEFORE A MEAL FOR ACID REFLUX   [DISCONTINUED] LINZESS  145 MCG CAPS capsule TAKE 1 CAPSULE BY MOUTH EVERY MORNING BEFORE BREAKFAST   [DISCONTINUED] sucralfate  (CARAFATE ) 1 g tablet Crush 1 tablet and dissolve in 10 ml of warm water, mix well to create slurry and drink before meals and at night   esomeprazole  (NEXIUM ) 40 MG capsule TAKE 1 CAPSULE BY MOUTH TWICE A DAY BEFORE A MEAL FOR ACID REFLUX   [DISCONTINUED] buPROPion (WELLBUTRIN XL) 300 MG 24 hr tablet Take 300 mg by mouth daily.   [DISCONTINUED] esomeprazole  (NEXIUM ) 40 MG capsule TAKE 1 CAPSULE BY MOUTH TWICE A DAY BEFORE A MEAL FOR ACID REFLUX   No facility-administered encounter medications on file as of 02/16/2024.     REVIEW OF SYSTEMS  : All other systems reviewed and negative except where noted in the History of Present Illness.   PHYSICAL EXAM: BP 116/64   Pulse 74   Ht 5' 8 (1.727 m)   Wt 188 lb 3.2 oz (85.4 kg)   BMI 28.62 kg/m  General: Well developed white female in no acute distress Head: Normocephalic and atraumatic Eyes:  Sclerae anicteric, conjunctiva pink. Ears: Normal auditory acuity Lungs: Clear throughout to auscultation; no W/R/R. Heart: Regular rate and rhythm; no M/R/G. Musculoskeletal: Symmetrical with no gross deformities  Skin: No lesions on visible extremities Extremities: No edema  Neurological: Alert oriented x 4, grossly non-focal Psychological:  Alert and cooperative. Normal mood and affect  ASSESSMENT AND PLAN: *GERD  and likely mild gastroparesis by GES: Overall doing okay with Nexium  40 mg BID before breakfast and dinner.  Use Pepcid  mid-day and at bedtime.  She also likes to keep some Carafate  suspension on hand for as needed use.  Follow pretty strict diet as well. *Chronic constipation: Currently has Linzess  145 mcg daily.  That may be too much.   **Of note:  She had esophageal pH study and manometry that were normal in 2019.  Last EGD was in 2017.  She has been on several PPIs in the past.  Had tried nortriptyline  previously and had side effects of nausea, vomiting, diarrhea with that.    -Continue Nexium  40  mg twice daily.  Continue famotidine  40 mg at bedtime.  Will refill both Nexium  and Carafate  suspension.  Carafate  suspension is to have on hand as needed as well. - Samples of Linzess  72 mcg given.  She will call or message with an update in 7 to 10 days to see if that dosing works better for her.   CC:  Deane Camie HERO., MD

## 2024-02-17 ENCOUNTER — Other Ambulatory Visit (HOSPITAL_COMMUNITY): Payer: Self-pay

## 2024-02-17 ENCOUNTER — Telehealth: Payer: Self-pay

## 2024-02-17 NOTE — Telephone Encounter (Signed)
 Pharmacy Patient Advocate Encounter   Received notification from CoverMyMeds that prior authorization for Sucralfate  1GM/10ML suspension is required/requested.   Insurance verification completed.   The patient is insured through [facility] ADVANTAGE/RX ADVANCE .   Per test claim:  Sucralfate  TABLETS is preferred by the insurance.  If suggested medication is appropriate, Please send in a new RX and discontinue this one. If not, please advise as to why it's not appropriate so that we may request a Prior Authorization. Please note, some preferred medications may still require a PA.  If the suggested medications have not been trialed and there are no contraindications to their use, the PA will not be submitted, as it will not be approved.   Co-pay for tablets, #10 per 10, is $0.90

## 2024-02-17 NOTE — Telephone Encounter (Signed)
 I will close out PA for suspension, then. Thank you

## 2024-02-17 NOTE — Telephone Encounter (Signed)
 Patient is aware that suspension is not covered and typically just pays out of pocket.

## 2024-02-23 ENCOUNTER — Encounter: Payer: Self-pay | Admitting: Gastroenterology
